# Patient Record
Sex: Female | Born: 1970
Health system: Southern US, Community
[De-identification: ages and names within clinical notes are randomized; demographics above are authoritative.]

## PROBLEM LIST (undated history)

## (undated) DIAGNOSIS — T7840XA Allergy, unspecified, initial encounter: Secondary | ICD-10-CM

## (undated) DIAGNOSIS — F419 Anxiety disorder, unspecified: Secondary | ICD-10-CM

## (undated) DIAGNOSIS — K589 Irritable bowel syndrome without diarrhea: Secondary | ICD-10-CM

## (undated) DIAGNOSIS — F32A Depression, unspecified: Secondary | ICD-10-CM

## (undated) DIAGNOSIS — G43909 Migraine, unspecified, not intractable, without status migrainosus: Secondary | ICD-10-CM

## (undated) DIAGNOSIS — F329 Major depressive disorder, single episode, unspecified: Secondary | ICD-10-CM

## (undated) DIAGNOSIS — K219 Gastro-esophageal reflux disease without esophagitis: Secondary | ICD-10-CM

## (undated) DIAGNOSIS — D649 Anemia, unspecified: Secondary | ICD-10-CM

## (undated) DIAGNOSIS — G2581 Restless legs syndrome: Secondary | ICD-10-CM

## (undated) HISTORY — DX: Irritable bowel syndrome, unspecified: K58.9

## (undated) HISTORY — PX: OTHER SURGICAL HISTORY: SHX169

## (undated) HISTORY — PX: CHOLECYSTECTOMY: SHX55

## (undated) HISTORY — DX: Gastro-esophageal reflux disease without esophagitis: K21.9

## (undated) HISTORY — DX: Migraine, unspecified, not intractable, without status migrainosus: G43.909

## (undated) HISTORY — DX: Anxiety disorder, unspecified: F41.9

## (undated) HISTORY — DX: Anemia, unspecified: D64.9

## (undated) HISTORY — DX: Restless legs syndrome: G25.81

## (undated) HISTORY — DX: Depression, unspecified: F32.A

## (undated) HISTORY — DX: Major depressive disorder, single episode, unspecified: F32.9

## (undated) HISTORY — DX: Allergy, unspecified, initial encounter: T78.40XA

---

## 1998-01-04 ENCOUNTER — Other Ambulatory Visit: Admission: RE | Admit: 1998-01-04 | Discharge: 1998-01-04 | Payer: Self-pay | Admitting: Gynecology

## 1999-01-03 ENCOUNTER — Other Ambulatory Visit: Admission: RE | Admit: 1999-01-03 | Discharge: 1999-01-03 | Payer: Self-pay | Admitting: Gynecology

## 2000-03-12 ENCOUNTER — Other Ambulatory Visit: Admission: RE | Admit: 2000-03-12 | Discharge: 2000-03-12 | Payer: Self-pay | Admitting: Gynecology

## 2001-04-11 ENCOUNTER — Other Ambulatory Visit: Admission: RE | Admit: 2001-04-11 | Discharge: 2001-04-11 | Payer: Self-pay | Admitting: Gynecology

## 2002-05-05 ENCOUNTER — Other Ambulatory Visit: Admission: RE | Admit: 2002-05-05 | Discharge: 2002-05-05 | Payer: Self-pay | Admitting: Gynecology

## 2003-05-07 ENCOUNTER — Other Ambulatory Visit: Admission: RE | Admit: 2003-05-07 | Discharge: 2003-05-07 | Payer: Self-pay | Admitting: Gynecology

## 2004-03-11 ENCOUNTER — Encounter: Admission: RE | Admit: 2004-03-11 | Discharge: 2004-03-11 | Payer: Self-pay | Admitting: Gynecology

## 2004-05-12 ENCOUNTER — Other Ambulatory Visit: Admission: RE | Admit: 2004-05-12 | Discharge: 2004-05-12 | Payer: Self-pay | Admitting: Gynecology

## 2005-05-15 ENCOUNTER — Other Ambulatory Visit: Admission: RE | Admit: 2005-05-15 | Discharge: 2005-05-15 | Payer: Self-pay | Admitting: Gynecology

## 2005-12-04 ENCOUNTER — Ambulatory Visit: Payer: Self-pay | Admitting: Gastroenterology

## 2005-12-17 ENCOUNTER — Ambulatory Visit (HOSPITAL_COMMUNITY): Admission: RE | Admit: 2005-12-17 | Discharge: 2005-12-17 | Payer: Self-pay | Admitting: Gastroenterology

## 2005-12-23 ENCOUNTER — Ambulatory Visit: Payer: Self-pay | Admitting: Gastroenterology

## 2006-02-03 ENCOUNTER — Ambulatory Visit (HOSPITAL_COMMUNITY): Admission: RE | Admit: 2006-02-03 | Discharge: 2006-02-03 | Payer: Self-pay | Admitting: Internal Medicine

## 2006-06-02 ENCOUNTER — Other Ambulatory Visit: Admission: RE | Admit: 2006-06-02 | Discharge: 2006-06-02 | Payer: Self-pay | Admitting: Gynecology

## 2006-07-08 ENCOUNTER — Encounter: Admission: RE | Admit: 2006-07-08 | Discharge: 2006-07-08 | Payer: Self-pay | Admitting: Gynecology

## 2007-06-03 ENCOUNTER — Other Ambulatory Visit: Admission: RE | Admit: 2007-06-03 | Discharge: 2007-06-03 | Payer: Self-pay | Admitting: Gynecology

## 2008-07-13 ENCOUNTER — Encounter: Payer: Self-pay | Admitting: Gynecology

## 2008-07-13 ENCOUNTER — Other Ambulatory Visit: Admission: RE | Admit: 2008-07-13 | Discharge: 2008-07-13 | Payer: Self-pay | Admitting: Gynecology

## 2008-07-13 ENCOUNTER — Ambulatory Visit: Payer: Self-pay | Admitting: Gynecology

## 2009-06-26 ENCOUNTER — Encounter: Admission: RE | Admit: 2009-06-26 | Discharge: 2009-06-26 | Payer: Self-pay | Admitting: Internal Medicine

## 2009-09-13 ENCOUNTER — Encounter: Admission: RE | Admit: 2009-09-13 | Discharge: 2009-09-13 | Payer: Self-pay | Admitting: Gynecology

## 2009-09-20 ENCOUNTER — Ambulatory Visit: Payer: Self-pay | Admitting: Gynecology

## 2009-09-20 ENCOUNTER — Other Ambulatory Visit: Admission: RE | Admit: 2009-09-20 | Discharge: 2009-09-20 | Payer: Self-pay | Admitting: Gynecology

## 2010-04-20 ENCOUNTER — Encounter: Payer: Self-pay | Admitting: Gynecology

## 2010-08-15 NOTE — Assessment & Plan Note (Signed)
[facility]                           GASTROENTEROLOGY OFFICE NOTE   [name], [name]                    MRN:          [id number]  DATE:12/04/2005                            DOB:          31-May-1970    REFERRING PHYSICIAN:  Lucky Cowboy, M.D.   REASON FOR REFERRAL:  Dr. Oneta Rack asked me to evaluate Tina Holmes in  consultation regarding GERD symptoms and abdominal discomfort.   HISTORY OF PRESENT ILLNESS:  [name] is a very pleasant 40 year old  woman who has had 4 to 5 years of GERD-like symptoms. Specifically, she has  had burning in her mid epigastrium and nausea intermittently. This was  improved on Nexium for several years but then more recently, has started to  get worse. She was changed over to Zegerid 40 mg twice daily and has noticed  this has improved slightly. She has not noticed any specific relation to  food, laying down. She does not drink much caffeine, alcohol, or smoke  cigarettes, although she does eat chocolate several times a week.   She was also having trouble with loose stools that began shortly after she  had a laparoscopic cholecystectomy 9 years ago. She changed to a specific  regimen of Loperamide and mecobalamine and on this, her bowels have been  much improved for several years.   REVIEW OF SYSTEMS:  Notable for stable weight. Otherwise is essentially  normal and is available on the nurse intake sheet.   PAST MEDICAL HISTORY:  Laparoscopic cholecystectomy in 1998.   CURRENT MEDICATIONS:  Zegerid, mecobalamine, Loperamide, L-lysine, TriNessa.   ALLERGIES:  CODEINE.   SOCIAL HISTORY:  Married with 1 son. Works as an Product/process development scientist for [employer]. Non-smoker, non-drinker.   FAMILY HISTORY:  No colon cancer or colon polyps in the family.   PHYSICAL EXAMINATION:  VITAL SIGNS:  Height 5 foot, 3 inches. Weight 121  pounds. Blood pressure 120/70. Pulse 60.  GENERAL:  A well appearing,  neurologically alert, oriented x3.  HEENT:  Extraocular muscles intact. Mouth, oropharynx moist. No lesions.  NECK:  Supple. No lymphadenopathy.  CARDIOVASCULAR:  Regular rate and rhythm.  LUNGS:  Clear to auscultation bilaterally.  ABDOMEN:  Soft, nontender, and nondistended. Bowel sounds normal.  EXTREMITIES:  No lower extremity edema.  SKIN:  No rashes or lesions on visible extremities.   ASSESSMENT/PLAN:  A 40 year old woman with gastroesophageal reflux disease-  like symptoms and dyspepsia. She has had 5 years of these symptoms, although  they do seem to be helped with PPI. I think we should proceed with EGD at  soonest convenience to rule out Barrett's or other complications from  longstanding reflux disease. She is current on Zegerid twice daily. I am  going to have her switch back to Nexium. I do not think she was taking it at  the correct time in relation to food. She was taking it about an hour to an  hour and a half prior to her breakfast meal, when it is actually designed to  work best if it is taken 20 to 30 minutes prior to a meal. She will  begin  doing that now. I also recommended that she try going dairy-free for a week  and if that helps, she will do a dairy challenge. She does eat a bit of  chocolate and maybe that is pushing her over the edge with her heartburn so  if dairy does not make a difference, she is going to try cutting out  chocolate for several days. She will have her EGD shortly after all of this  and we will talk about how both of these dietary maneuvers has affected her.  Will also get a C-met today. She had a CBC done earlier this month, showing  a hemoglobin of 11.8.                                   Rachael Fee, MD   DPJ/MedQ  DD:  12/04/2005  DT:  12/05/2005  Job #:  [id number]   cc:   Lucky Cowboy, M.D.

## 2010-10-07 ENCOUNTER — Other Ambulatory Visit: Payer: Self-pay | Admitting: Gynecology

## 2010-10-07 DIAGNOSIS — Z1231 Encounter for screening mammogram for malignant neoplasm of breast: Secondary | ICD-10-CM

## 2010-10-14 ENCOUNTER — Ambulatory Visit
Admission: RE | Admit: 2010-10-14 | Discharge: 2010-10-14 | Disposition: A | Discharge status: Home / Self Care | Payer: Commercial Managed Care - PPO | Source: Ambulatory Visit | Attending: Gynecology | Admitting: Gynecology

## 2010-10-14 DIAGNOSIS — Z1231 Encounter for screening mammogram for malignant neoplasm of breast: Secondary | ICD-10-CM

## 2010-12-23 ENCOUNTER — Other Ambulatory Visit: Payer: Self-pay | Admitting: Gynecology

## 2011-01-07 DIAGNOSIS — K219 Gastro-esophageal reflux disease without esophagitis: Secondary | ICD-10-CM | POA: Insufficient documentation

## 2011-01-14 ENCOUNTER — Ambulatory Visit (INDEPENDENT_AMBULATORY_CARE_PROVIDER_SITE_OTHER): Payer: Commercial Managed Care - PPO | Admitting: Gynecology

## 2011-01-14 ENCOUNTER — Encounter: Payer: Self-pay | Admitting: Gynecology

## 2011-01-14 VITALS — BP 128/80 | Ht 62.25 in | Wt 137.0 lb

## 2011-01-14 DIAGNOSIS — Z01419 Encounter for gynecological examination (general) (routine) without abnormal findings: Secondary | ICD-10-CM

## 2011-01-14 DIAGNOSIS — Z3049 Encounter for surveillance of other contraceptives: Secondary | ICD-10-CM

## 2011-01-14 DIAGNOSIS — Z23 Encounter for immunization: Secondary | ICD-10-CM

## 2011-01-14 MED ORDER — NORGESTIM-ETH ESTRAD TRIPHASIC 0.18/0.215/0.25 MG-35 MCG PO TABS: 1.0000 | ORAL_TABLET | Freq: Every day | ORAL | Status: DC

## 2011-01-14 NOTE — Progress Notes (Signed)
Tina Holmes 19-May-1970 [id number]        40 y.o.  for annual exam.  Doing well on oral contraceptives.  Past medical history,surgical history, medications, allergies, family history and social history were all reviewed and documented in the EPIC chart. ROS:  Was performed and pertinent positives and negatives are included in the history.  Exam: chaperone present Filed Vitals:   01/14/11 1448  BP: 128/80   General appearance  Normal Skin grossly normal Head/Neck normal with no cervical or supraclavicular adenopathy thyroid normal Lungs  clear Cardiac RR, without RMG Abdominal  soft, nontender, without masses, organomegaly or hernia Breasts  examined lying and sitting without masses, retractions, discharge or axillary adenopathy. Pelvic  Ext/BUS/vagina  normal   Cervix  normal  Pap not done  Uterus  anteverted, normal size, shape and contour, midline and mobile nontender   Adnexa  Without masses or tenderness    Anus and perineum  normal   Rectovaginal  normal sphincter tone without palpated masses or tenderness.    Assessment/Plan:  40 y.o. female for annual exam.   Doing well. On oral contraceptives wants to continue, I refilled her times a year. I discussed risks of stroke heart attack DVT she is low risk not being followed for medical issues does not smoke accepts the risks. Patient has history of mild dysplasia over 20 years ago status post cryo-. Her Pap smears have all been normal since then for the last 20 years. I discussed current screening recommendations with decreased frequency at every 3 years and she wants to go ahead and do this so I did not do a Pap smear today as her last Pap smear was last year.  Self breast exams on a monthly basis discussed encouraged. Had mammography in July we'll continue with annual mammography. She had blood work done to her primary did show some iron deficiency anemia although she never started iron supplement I recommend she start an iron  supplement daily. Her other blood work is normal and no blood work was done today.  Assuming she continues well she'll see me in a year sooner as needed. Flu shot received today.    Dara Lords MD, 3:27 PM 01/14/2011

## 2011-10-27 ENCOUNTER — Other Ambulatory Visit: Payer: Self-pay | Admitting: Gynecology

## 2011-10-27 DIAGNOSIS — Z1231 Encounter for screening mammogram for malignant neoplasm of breast: Secondary | ICD-10-CM

## 2011-11-09 ENCOUNTER — Ambulatory Visit
Admission: RE | Admit: 2011-11-09 | Discharge: 2011-11-09 | Disposition: A | Discharge status: Home / Self Care | Payer: Commercial Managed Care - PPO | Source: Ambulatory Visit | Attending: Gynecology | Admitting: Gynecology

## 2011-11-09 DIAGNOSIS — Z1231 Encounter for screening mammogram for malignant neoplasm of breast: Secondary | ICD-10-CM

## 2012-01-11 ENCOUNTER — Telehealth: Payer: Self-pay | Admitting: *Deleted

## 2012-01-11 NOTE — Telephone Encounter (Signed)
Pt calling c/o UTI, left message on voicemail, OV best.

## 2012-02-17 ENCOUNTER — Other Ambulatory Visit: Payer: Self-pay | Admitting: Gynecology

## 2012-02-29 ENCOUNTER — Encounter: Payer: Commercial Managed Care - PPO | Admitting: Gynecology

## 2012-03-09 ENCOUNTER — Other Ambulatory Visit (HOSPITAL_COMMUNITY)
Admission: RE | Admit: 2012-03-09 | Discharge: 2012-03-09 | Disposition: A | Discharge status: Home / Self Care | Payer: Commercial Managed Care - PPO | Source: Ambulatory Visit | Attending: Gynecology | Admitting: Gynecology

## 2012-03-09 ENCOUNTER — Ambulatory Visit (INDEPENDENT_AMBULATORY_CARE_PROVIDER_SITE_OTHER): Payer: Commercial Managed Care - PPO | Admitting: Gynecology

## 2012-03-09 ENCOUNTER — Encounter: Payer: Self-pay | Admitting: Gynecology

## 2012-03-09 VITALS — BP 114/60 | Ht 62.5 in | Wt 135.0 lb

## 2012-03-09 DIAGNOSIS — Z01419 Encounter for gynecological examination (general) (routine) without abnormal findings: Secondary | ICD-10-CM | POA: Insufficient documentation

## 2012-03-09 DIAGNOSIS — Z1151 Encounter for screening for human papillomavirus (HPV): Secondary | ICD-10-CM | POA: Insufficient documentation

## 2012-03-09 DIAGNOSIS — Z309 Encounter for contraceptive management, unspecified: Secondary | ICD-10-CM

## 2012-03-09 MED ORDER — NORGESTIM-ETH ESTRAD TRIPHASIC 0.18/0.215/0.25 MG-35 MCG PO TABS: 1.0000 | ORAL_TABLET | Freq: Every day | ORAL | Status: DC

## 2012-03-09 NOTE — Patient Instructions (Addendum)
Intrauterine Device Insertion Most often, an intrauterine device (IUD) is inserted into the uterus to prevent pregnancy. There are 2 types of IUDs available:  Copper IUD. This type of IUD creates an environment that is not favorable to sperm survival. The mechanism of action of the copper IUD is not known for certain. It can stay in place for 10 years.  Hormone IUD. This type of IUD contains the hormone progestin (synthetic progesterone). The progestin thickens the cervical mucus and prevents sperm from entering the uterus, and it also thins the uterine lining. There is no evidence that the hormone IUD prevents implantation. The hormone IUD can stay in place for up to 5 years. An IUD is the most cost-effective birth control if left in place for the full duration. It may be removed at any time. LET YOUR CAREGIVER KNOW ABOUT:  Sensitivity to metals.  Medicines taken including herbs, eyedrops, over-the-counter medicines, and creams.  Use of steroids (by mouth or creams).  Previous problems with anesthetics or numbing medicine.  Previous gynecological surgery.  History of blood clots or clotting disorders.  Possibility of pregnancy.  Menstrual irregularities.  Concerns regarding unusual vaginal discharge or odors.  Previous experience with an IUD.  Other health problems. RISKS AND COMPLICATIONS  Accidental puncture (perforation) of the uterus.  Accidental placement of the IUD either in the muscle layer of the uterus (myometrium) or outside the uterus. If this happen, the IUD can be found essentially floating around the bowels. When this happens, the IUD must be taken out surgically.  The IUD may fall out of the uterus (expulsion). This is more common in women who have recently had a child.   Pregnancy in the fallopian tube (ectopic). BEFORE THE PROCEDURE  Schedule the IUD insertion for when you will have your menstrual period or right after, to make sure you are not pregnant.  Placement of the IUD is better tolerated shortly after a menstrual cycle.  You may need to take tests or be examined to make sure you are not pregnant.  You may be required to take a pregnancy test.  You may be required to get checked for sexually transmitted infections (STIs) prior to placement. Placing an IUD in someone who has an infection can make an infection worse.  You may be given a pain reliever to take 1 or 2 hours before the procedure.  An exam will be performed to determine the size and position of your uterus.  Ask your caregiver about changing or stopping your regular medicines. PROCEDURE   A tool (speculum) is placed in the vagina. This allows your caregiver to see the lower part of the uterus (cervix).  The cervix is prepped with a medicine that lowers the risk of infection.  You may be given a medicine to numb each side of the cervix (intracervical or paracervical block). This is used to block and control any discomfort with insertion.  A tool (uterine sound) is inserted into the uterus to determine the length of the uterine cavity and the direction the uterus may be tilted.  A slim instrument (IUD inserter) is inserted through the cervical canal and into your uterus.  The IUD is placed in the uterine cavity and the insertion device is removed.  The nylon string that is attached to the IUD, and used for eventual IUD removal, is trimmed. It is trimmed so that it lays high in the vagina, just outside the cervix. AFTER THE PROCEDURE  You may have bleeding after the  attached to the IUD, and used for eventual IUD removal, is trimmed. It is trimmed so that it lays high in the vagina, just outside the cervix.  AFTER THE PROCEDURE  · You may have bleeding after the procedure. This is normal. It varies from light spotting for a few days to menstrual-like bleeding.   · You may have mild cramping.  · Practice checking the string coming out of the cervix to make sure the IUD remains in the uterus. If you cannot feel the string, you should schedule a "string check" with your caregiver.  · If you had a hormone IUD inserted, expect that your period may be lighter or nonexistent  within a year's time (though this is not always the case). There may be delayed fertility with the hormone IUD as a result of its progesterone effect. When you are ready to become pregnant, it is suggested to have the IUD removed up to 1 year in advance.  · Yearly exams are advised.  Document Released: 11/12/2010 Document Revised: 06/08/2011 Document Reviewed: 11/12/2010  Exit[employer]® Patient Information ©2013 [employer].

## 2012-03-09 NOTE — Progress Notes (Signed)
Tina Holmes 08-Nov-1970 [id number]        41 y.o.  G2P0011 for annual exam.  Doing well.  Past medical history,surgical history, medications, allergies, family history and social history were all reviewed and documented in the EPIC chart. ROS:  Was performed and pertinent positives and negatives are included in the history.  Exam: Sherrilyn Rist assistant Filed Vitals:   03/09/12 1502  BP: 114/60  Height: 5' 2.5" (1.588 m)  Weight: 135 lb (61.236 kg)   General appearance  Normal Skin grossly normal Head/Neck normal with no cervical or supraclavicular adenopathy thyroid normal Lungs  clear Cardiac RR, without RMG Abdominal  soft, nontender, without masses, organomegaly or hernia Breasts  examined lying and sitting without masses, retractions, discharge or axillary adenopathy. Pelvic  Ext/BUS/vagina  normal   Cervix  normal Pap/HPV done  Uterus  anteverted, normal size, shape and contour, midline and mobile nontender   Adnexa  Without masses or tenderness    Anus and perineum  normal   Rectovaginal  normal sphincter tone without palpated masses or tenderness.    Assessment/Plan:  41 y.o. G57P0011 female for annual exam.   1. Contraceptive management. Patient on low-dose oral contraceptives. Husband refusing vasectomy. Discussed alternatives to include continuing low-dose pills. Risk of stroke heart attack DVT reviewed. Switching to a different method such as IUD. I gave her literature on the Mirena IUD and discussed the insertional process. I reviewed risks in general to include infection perforation migration and failure. Patient wants to think about follow up if she wants to schedule. Otherwise I refilled her oral contraceptives to continue at present. 2. Mammography 10/2011. Continue with annual mammography. SBE monthly reviewed. 3. Pap smear 08/2009. Patient nervous about the new guidelines would prefer Pap smear this year. Pap/HPV done. Does have history of mild dysplasia status post  cryo-surgery 20 years ago with normal intervening Pap smears. 4. Health maintenance. No labs done as they were all done at her primary physician's office follow up one year or with IUD if she chooses to have placed. 5. Health maintenance.    Dara Lords MD, 3:41 PM 03/09/2012

## 2012-05-13 ENCOUNTER — Other Ambulatory Visit: Payer: Self-pay | Admitting: Gynecology

## 2013-01-02 ENCOUNTER — Other Ambulatory Visit: Payer: Self-pay

## 2013-01-02 DIAGNOSIS — Z1231 Encounter for screening mammogram for malignant neoplasm of breast: Secondary | ICD-10-CM

## 2013-01-12 ENCOUNTER — Ambulatory Visit: Payer: Commercial Managed Care - PPO

## 2013-01-16 ENCOUNTER — Ambulatory Visit
Admission: RE | Admit: 2013-01-16 | Discharge: 2013-01-16 | Disposition: A | Discharge status: Home / Self Care | Payer: Commercial Managed Care - PPO | Source: Ambulatory Visit

## 2013-01-16 DIAGNOSIS — Z1231 Encounter for screening mammogram for malignant neoplasm of breast: Secondary | ICD-10-CM

## 2013-02-15 ENCOUNTER — Encounter: Payer: Self-pay | Admitting: Internal Medicine

## 2013-02-15 DIAGNOSIS — K589 Irritable bowel syndrome without diarrhea: Secondary | ICD-10-CM | POA: Insufficient documentation

## 2013-02-15 DIAGNOSIS — G43909 Migraine, unspecified, not intractable, without status migrainosus: Secondary | ICD-10-CM | POA: Insufficient documentation

## 2013-02-16 ENCOUNTER — Ambulatory Visit: Payer: Commercial Managed Care - PPO | Admitting: Emergency Medicine

## 2013-02-16 ENCOUNTER — Encounter: Payer: Self-pay | Admitting: Emergency Medicine

## 2013-02-16 VITALS — BP 110/72 | HR 60 | Temp 98.2°F | Resp 18 | Ht 63.0 in | Wt 143.0 lb

## 2013-02-16 DIAGNOSIS — F411 Generalized anxiety disorder: Secondary | ICD-10-CM

## 2013-02-16 DIAGNOSIS — D649 Anemia, unspecified: Secondary | ICD-10-CM

## 2013-02-16 DIAGNOSIS — R002 Palpitations: Secondary | ICD-10-CM

## 2013-02-16 DIAGNOSIS — R5381 Other malaise: Secondary | ICD-10-CM

## 2013-02-16 DIAGNOSIS — R5383 Other fatigue: Secondary | ICD-10-CM

## 2013-02-16 DIAGNOSIS — E559 Vitamin D deficiency, unspecified: Secondary | ICD-10-CM

## 2013-02-16 DIAGNOSIS — Z23 Encounter for immunization: Secondary | ICD-10-CM

## 2013-02-16 LAB — CBC WITH DIFFERENTIAL/PLATELET
Basophils Absolute: 0 10*3/uL (ref 0.0–0.1)
HCT: 36.8 % (ref 36.0–46.0)
Hemoglobin: 12 g/dL (ref 12.0–15.0)
Lymphocytes Relative: 23 % (ref 12–46)
Lymphs Abs: 1.5 10*3/uL (ref 0.7–4.0)
MCH: 27.2 pg (ref 26.0–34.0)
MCHC: 32.6 g/dL (ref 30.0–36.0)
Monocytes Absolute: 0.5 10*3/uL (ref 0.1–1.0)
Neutro Abs: 4.3 10*3/uL (ref 1.7–7.7)
Platelets: 277 10*3/uL (ref 150–400)
RBC: 4.41 MIL/uL (ref 3.87–5.11)
RDW: 16.4 % — ABNORMAL HIGH (ref 11.5–15.5)
WBC: 6.4 10*3/uL (ref 4.0–10.5)

## 2013-02-16 LAB — BASIC METABOLIC PANEL WITH GFR
BUN: 10 mg/dL (ref 6–23)
CO2: 29 mEq/L (ref 19–32)
Calcium: 9.3 mg/dL (ref 8.4–10.5)
Chloride: 102 mEq/L (ref 96–112)
GFR, Est African American: >89 mL/min
GFR, Est Non African American: >89 mL/min
Glucose, Bld: 89 mg/dL (ref 70–99)
Potassium: 4.4 mEq/L (ref 3.5–5.3)
Sodium: 140 mEq/L (ref 135–145)

## 2013-02-16 MED ORDER — DIAZEPAM 2 MG PO TABS: 2.0000 mg | ORAL_TABLET | Freq: Three times a day (TID) | ORAL | Status: DC | PRN

## 2013-02-16 NOTE — Patient Instructions (Addendum)
Vitamin B12 Deficiency Not having enough vitamin B12 is called a deficiency. Your body needs this vitamin for important body functions. HOME CARE  Take all vitamins, herbs, or nutrition drinks (supplements) as told by your doctor.  Get shots (injections) as told. Do not miss your doctor visit.  Eat foods than contain vitamin B12. This includes:  Meat.  Chicken, [place], or other birds (poultry).  Fish.  Eggs.  Cereals or milk with added vitamin B12. Check the label.  Do not drink too much (abuse) alcohol.  Keep all doctor visits as told. GET HELP IF:  You have questions.  Your problems come back. MAKE SURE YOU:  Understand these instructions.  Will watch your condition.  Will get help right away if you are not doing well or get worse. Document Released: 03/05/2011 Document Revised: 06/08/2011 Document Reviewed: 03/05/2011 [facility] Patient Information 2014 [place], [place]. Palpitations  A palpitation is the feeling that your heartbeat is irregular. It may feel like your heart is fluttering or skipping a beat. It may also feel like your heart is beating faster than normal. This is usually not a serious problem. In some cases, you may need more medical tests. HOME CARE  Avoid:  Caffeine in coffee, tea, soft drinks, diet pills, and energy drinks.  Chocolate.  Alcohol.  Stop smoking if you smoke.  Reduce your stress and anxiety. Try:  A method that measures bodily functions so you can learn to control them (biofeedback).  Yoga.  Meditation.  Physical activity such as swimming, jogging, or walking.  Get plenty of rest and sleep. GET HELP RIGHT AWAY IF:   You have chest pain.  You feel short of breath.  You have a very bad headache.  You feel dizzy or pass out (faint).  Your fast or irregular heartbeat continues after 24 hours.  Your palpitations occur more often. MAKE SURE YOU:   Understand these instructions.  Will watch your condition.  Will  get help right away if you are not doing well or get worse. Document Released: 12/24/2007 Document Revised: 09/15/2011 Document Reviewed: 05/15/2011 [facility] Patient Information 2014 [place], [place].

## 2013-02-17 LAB — VITAMIN B12: Vitamin B-12: 359 pg/mL (ref 211–911)

## 2013-02-17 LAB — TSH: TSH: 1.273 u[IU]/mL (ref 0.350–4.500)

## 2013-02-17 NOTE — Progress Notes (Signed)
  Subjective:    Patient ID: Tina Holmes, female    DOB: January 08, 1971, 42 y.o.   MRN: [id number]  HPI Comments: 42 yo presents for f/u with anemia. She has not added B12 VIT or increased greens AD. She is still with fatigue and has occasional palpitations. She denies specific triggers for palpitations, but admits to increase stress, not eating regularly, and low H2O intake. She notes migraines have increased with stress but Fioricet helps significantly. She tried a coworkers Valium at work when she was having increased stress and she felt it worked better than Xanax.    Current Outpatient Prescriptions on File Prior to Visit  Medication Sig Dispense Refill  . ALPRAZolam (XANAX) 1 MG tablet Take 1 mg by mouth at bedtime as needed for anxiety.      . Cholecalciferol (VITAMIN D PO) Take 6,000 Units by mouth.         No current facility-administered medications on file prior to visit.  SHE IS ALSO ON TRIPREVIFEM OCP FIORICET 50/325/40  ALLERGIES Celexa; Codeine; Imitrex; and Zoloft  Past Medical History  Diagnosis Date  . Restless leg syndrome   . Depression   . GERD (gastroesophageal reflux disease)   . IBS (irritable bowel syndrome)   . Migraines   . Anxiety     Review of Systems  Constitutional: Positive for fatigue.  Cardiovascular: Positive for palpitations.  Neurological: Positive for headaches.  Psychiatric/Behavioral: The patient is nervous/anxious.   All other systems reviewed and are negative.    BP 110/72  Pulse 60  Temp(Src) 98.2 F (36.8 C) (Temporal)  Resp 18  Ht 5\' 3"  (1.6 m)  Wt 143 lb (64.864 kg)  BMI 25.34 kg/m2  LMP 02/09/2013     Objective:   Physical Exam  Nursing note and vitals reviewed. Constitutional: She is oriented to person, place, and time. She appears well-developed and well-nourished.  HENT:  Head: Normocephalic and atraumatic.  Right Ear: External ear normal.  Left Ear: External ear normal.  Eyes: Conjunctivae are normal. Pupils  are equal, round, and reactive to light.  Neck: Normal range of motion. Neck supple. No thyromegaly present.  Cardiovascular: Normal rate, regular rhythm, normal heart sounds and intact distal pulses.   Pulmonary/Chest: Effort normal and breath sounds normal.  Abdominal: Soft. Bowel sounds are normal. She exhibits no distension and no mass. There is no tenderness.  Musculoskeletal: Normal range of motion.  Lymphadenopathy:    She has no cervical adenopathy.  Neurological: She is alert and oriented to person, place, and time. No cranial nerve deficit. Coordination normal.  Skin: Skin is warm and dry.  Psychiatric: She has a normal mood and affect. Her behavior is normal. Judgment and thought content normal.          Assessment & Plan:  1. F/u anemia with history of fatigue and palpitations, recheck labs. Advise regular exercise, healthier diet with increased protein snacks, increased H2O. If labs neg and no improvement will need cardiac eval, she w/c with results 2. Anxiety/ stress- discussed stress reduction, switch from XANAX to Valium AD will call with results.

## 2013-02-20 ENCOUNTER — Encounter: Payer: Self-pay | Admitting: Emergency Medicine

## 2013-02-20 MED ORDER — LORAZEPAM 1 MG PO TABS: ORAL_TABLET | ORAL | Status: DC

## 2013-03-13 ENCOUNTER — Encounter: Payer: Commercial Managed Care - PPO | Admitting: Gynecology

## 2013-03-28 ENCOUNTER — Other Ambulatory Visit: Payer: Self-pay | Admitting: Gynecology

## 2013-03-28 NOTE — Telephone Encounter (Signed)
Annual scheduled on 04/24/13

## 2013-04-10 ENCOUNTER — Other Ambulatory Visit: Payer: Self-pay | Admitting: Emergency Medicine

## 2013-04-10 MED ORDER — LORAZEPAM 1 MG PO TABS: ORAL_TABLET | ORAL | Status: DC

## 2013-04-24 ENCOUNTER — Encounter: Payer: Self-pay | Admitting: Gynecology

## 2013-04-24 ENCOUNTER — Ambulatory Visit (INDEPENDENT_AMBULATORY_CARE_PROVIDER_SITE_OTHER): Payer: Commercial Managed Care - PPO | Admitting: Gynecology

## 2013-04-24 VITALS — BP 120/74 | Ht 62.0 in | Wt 141.0 lb

## 2013-04-24 DIAGNOSIS — N898 Other specified noninflammatory disorders of vagina: Secondary | ICD-10-CM

## 2013-04-24 DIAGNOSIS — Z01419 Encounter for gynecological examination (general) (routine) without abnormal findings: Secondary | ICD-10-CM

## 2013-04-24 DIAGNOSIS — N949 Unspecified condition associated with female genital organs and menstrual cycle: Secondary | ICD-10-CM

## 2013-04-24 LAB — URINALYSIS W MICROSCOPIC + REFLEX CULTURE
BILIRUBIN URINE: NEGATIVE
Bacteria, UA: NONE SEEN
CASTS: NONE SEEN
CRYSTALS: NONE SEEN
Glucose, UA: NEGATIVE mg/dL
Ketones, ur: NEGATIVE mg/dL
NITRITE: NEGATIVE
PH: 5.5 (ref 5.0–8.0)
Protein, ur: NEGATIVE mg/dL
Specific Gravity, Urine: 1.024 (ref 1.005–1.030)
Urobilinogen, UA: 0.2 mg/dL (ref 0.0–1.0)

## 2013-04-24 LAB — WET PREP FOR TRICH, YEAST, CLUE
Clue Cells Wet Prep HPF POC: NONE SEEN
Trich, Wet Prep: NONE SEEN
YEAST WET PREP: NONE SEEN

## 2013-04-24 MED ORDER — METRONIDAZOLE 500 MG PO TABS: 500.0000 mg | ORAL_TABLET | Freq: Two times a day (BID) | ORAL | Status: DC

## 2013-04-24 MED ORDER — NORGESTIM-ETH ESTRAD TRIPHASIC 0.18/0.215/0.25 MG-35 MCG PO TABS: ORAL_TABLET | ORAL | Status: DC

## 2013-04-24 NOTE — Progress Notes (Signed)
Tina Holmes 02-Apr-1970 [id number]        43 y.o.  G2P0011 for annual exam.  Several issues noted below.  Past medical history,surgical history, problem list, medications, allergies, family history and social history were all reviewed and documented in the EPIC chart.  ROS:  Performed and pertinent positives and negatives are included in the history, assessment and plan .  Exam: Kim assistant Filed Vitals:   04/24/13 0844  BP: 120/74  Height: 5\' 2"  (1.575 m)  Weight: 141 lb (63.957 kg)   General appearance  Normal Skin grossly normal Head/Neck normal with no cervical or supraclavicular adenopathy thyroid normal Lungs  clear Cardiac RR, without RMG Abdominal  soft, nontender, without masses, organomegaly or hernia Breasts  examined lying and sitting without masses, retractions, discharge or axillary adenopathy. Pelvic  Ext/BUS/vagina  Normal  Cervix  Normal  Uterus  anteverted, normal size, shape and contour, midline and mobile nontender   Adnexa  Without masses or tenderness    Anus and perineum  Normal   Rectovaginal  Normal sphincter tone without palpated masses or tenderness.    Assessment/Plan:  43 y.o. G58P0011 female for annual exam regular menses, oral contraceptives.   1. Contraceptive management. I again reviewed oral contraceptives with advancing age and history of cigarette smoking in the past. Possible increased risk of thrombosis to include stroke heart attack DVT. We discussed alternatives multiple times in the past and she is comfortable continuing on oral contraceptives. She clearly understands the risks and accepts them. I refilled her Tri Previfem x1 year. 2. Vaginal odor. Patient notes a constant low-level vaginal odor. No discharge itching or other symptoms. Exam is normal. Wet prep is negative. We'll treat as a low-level bacterial vaginosis with Flagyl 500 mg twice a day x7 days, alcohol avoidance reviewed. Followup if symptoms persist, 3. Pap smear/HPV  negative 02/2012. No Pap smear done today. No history of significant abnormal Pap smears. Plan less frequent screening intervals. 4. Mammography 12/2012. Continue with annual mammography. SBE monthly reviewed. 5. Health maintenance. No blood work done as this is reportedly done through her primary physician's office. Followup one year, sooner as needed.    Note: This document was prepared with digital dictation and possible smart phrase technology. Any transcriptional errors that result from this process are unintentional.   Anastasio Auerbach MD, 9:23 AM 04/24/2013

## 2013-04-24 NOTE — Patient Instructions (Signed)
Take oral antibiotic twice daily for 7 days. Avoid alcohol while taking. Followup if your symptoms continue. Followup in one year for annual exam.

## 2013-04-25 LAB — URINE CULTURE
Colony Count: NO GROWTH
Organism ID, Bacteria: NO GROWTH

## 2013-06-26 ENCOUNTER — Other Ambulatory Visit: Payer: Self-pay

## 2013-06-26 MED ORDER — NORGESTIM-ETH ESTRAD TRIPHASIC 0.18/0.215/0.25 MG-35 MCG PO TABS: ORAL_TABLET | ORAL | Status: DC

## 2013-06-30 ENCOUNTER — Other Ambulatory Visit: Payer: Self-pay | Admitting: Emergency Medicine

## 2013-08-10 ENCOUNTER — Encounter: Payer: Self-pay | Admitting: Internal Medicine

## 2013-08-10 ENCOUNTER — Ambulatory Visit (INDEPENDENT_AMBULATORY_CARE_PROVIDER_SITE_OTHER): Payer: Commercial Managed Care - PPO | Admitting: Internal Medicine

## 2013-08-10 VITALS — BP 110/76 | HR 72 | Temp 98.0°F | Resp 16 | Ht 62.5 in | Wt 138.0 lb

## 2013-08-10 DIAGNOSIS — R740 Nonspecific elevation of levels of transaminase and lactic acid dehydrogenase [LDH]: Secondary | ICD-10-CM

## 2013-08-10 DIAGNOSIS — Z0000 Encounter for general adult medical examination without abnormal findings: Secondary | ICD-10-CM

## 2013-08-10 DIAGNOSIS — R7401 Elevation of levels of liver transaminase levels: Secondary | ICD-10-CM

## 2013-08-10 DIAGNOSIS — Z111 Encounter for screening for respiratory tuberculosis: Secondary | ICD-10-CM

## 2013-08-10 DIAGNOSIS — Z113 Encounter for screening for infections with a predominantly sexual mode of transmission: Secondary | ICD-10-CM

## 2013-08-10 DIAGNOSIS — E559 Vitamin D deficiency, unspecified: Secondary | ICD-10-CM | POA: Insufficient documentation

## 2013-08-10 DIAGNOSIS — Z1212 Encounter for screening for malignant neoplasm of rectum: Secondary | ICD-10-CM

## 2013-08-10 NOTE — Patient Instructions (Signed)

## 2013-08-10 NOTE — Progress Notes (Signed)
Patient ID: Tina Holmes, female   DOB: [birth year], 43 y.o.   MRN: [id number]   Annual Screening Comprehensive Examination   This very nice 43 y.o.MWF presents for complete physical.  Patient has no major health issues.    Patient has hx/o GERD & IBS - both of which have been quiescent over the last year.    Patient does have Hx/o borderline elevated A1c  o f 5.7% in 2011, then 5.8% in 2012 and last was 5.5% in May 2014. She has gained about 20 # over the last 4-5 years. She doesn't exercise.    Finally, patient has history of Vitamin D Deficiency with last vitamin D of 46 in May 2014.  Medication Sig  . Cholecalciferol (VITAMIN D PO) Take 6,000 Units by mouth.    . dicyclomine (BENTYL) 10 MG  Take 10 mg by mouth 4 (four) times daily as needed for spasms.  Marland Kitchen LORazepam (ATIVAN) 1 MG tablet TAKE 1 TABLET AT BEDTIME .Marland KitchenMarland Kitchen OCCASSIONALLY TWICE A DAY  . Norgestimate-Ethinyl Estradiol Triphasic (TRI-PREVIFEM) 0.18/0.215/0.25 MG-35 MCG  TAKE 1 TABLET BY MOUTH DAILY.   Allergies  Allergen Reactions  . Imitrex [Sumatriptan]     Throat swelling  . Celexa [Citalopram]     Decreased libido  . Codeine     N/V  . Zoloft [Sertraline Hcl]     N/V   Past Medical History  Diagnosis Date  . Restless leg syndrome   . Depression   . GERD (gastroesophageal reflux disease)   . IBS (irritable bowel syndrome)   . Migraines   . Anxiety     Past Surgical History  Procedure Laterality Date  . Termination of pregnanc      18 WEEKS--DOWNS SYNDROME  . Cholecystectomy     Family History  Problem Relation Age of Onset  . Diabetes Mother   . Hypertension Mother   . Heart attack Mother   . Hypertension Father   . CVA Maternal Grandfather    History   Social History  . Marital Status: Married    Spouse Name: N/A    Number of Children: N/A  . Years of Education: N/A   Occupational History  . Not on file.   Social History Main Topics  . Smoking status: Former Smoker    Quit date:  03/30/1996  . Smokeless tobacco: Never Used  . Alcohol Use: Yes     Comment: OCC  . Drug Use: No  . Sexual Activity: Yes    Birth Control/ Protection: Pill   Other Topics Concern  . Not on file   Social History Narrative  . No narrative on file    ROS Constitutional: Denies fever, chills, weight loss/gain, headaches, insomnia, fatigue, night sweats, and change in appetite. Eyes: Denies redness, blurred vision, diplopia, discharge, itchy, watery eyes.  ENT: Denies discharge, congestion, post nasal drip, epistaxis, sore throat, earache, hearing loss, dental pain, Tinnitus, Vertigo, Sinus pain, snoring.  Cardio: Denies chest pain, palpitations, irregular heartbeat, syncope, dyspnea, diaphoresis, orthopnea, PND, claudication, edema Respiratory: denies cough, dyspnea, DOE, pleurisy, hoarseness, laryngitis, wheezing.  Gastrointestinal: Denies dysphagia, heartburn, reflux, water brash, pain, cramps, nausea, vomiting, bloating, diarrhea, constipation, hematemesis, melena, hematochezia, jaundice, hemorrhoids Genitourinary: Denies dysuria, frequency, urgency, nocturia, hesitancy, discharge, hematuria, flank pain Breast: Breast lumps, nipple discharge, bleeding.  Musculoskeletal: Denies arthralgia, myalgia, stiffness, Jt. Swelling, pain, limp, and strain/sprain. Skin: Denies puritis, rash, hives, warts, acne, eczema, changing in skin lesion Neuro: Weakness, tremor, incoordination, spasms, paresthesia, pain Psychiatric: Denies confusion, memory loss,  sensory loss. Endocrine: Denies change in weight, skin, hair change, nocturia, and paresthesia, diabetic polys, visual blurring, hyper /hypo glycemic episodes.  Heme/Lymph: No excessive bleeding, bruising, enlarged lymph nodes.   Physical Exam  BP 110/76  Pulse 72  Temp 98 F   Resp 16  Ht 5' 2.5"   Wt 138 lb   BMI 24.82 kg/m2  LMP 07/27/2013  General Appearance: Well nourished, in no apparent distress. Eyes: PERRLA, EOMs, conjunctiva no  swelling or erythema, normal fundi and vessels. Sinuses: No frontal/maxillary tenderness ENT/Mouth: EACs patent / TMs  nl. Nares clear without erythema, swelling, mucoid exudates. Oral hygiene is good. No erythema, swelling, or exudate. Tongue normal, non-obstructing. Tonsils not swollen or erythematous. Hearing normal.  Neck: Supple, thyroid normal. No bruits, nodes or JVD. Respiratory: Respiratory effort normal.  BS equal and clear bilateral without rales, rhonci, wheezing or stridor. Cardio: Heart sounds are normal with regular rate and rhythm and no murmurs, rubs or gallops. Peripheral pulses are normal and equal bilaterally without edema. No aortic or femoral bruits. Chest: symmetric with normal excursions and percussion. Breasts: Symmetric, without lumps, nipple discharge, retractions, or fibrocystic changes.  Abdomen: Flat, soft, with bowl sounds. Nontender, no guarding, rebound, hernias, masses, or organomegaly.  Lymphatics: Non tender without lymphadenopathy.  Genitourinary:  Musculoskeletal: Full ROM all peripheral extremities, joint stability, 5/5 strength, and normal gait. Skin: Warm and dry without rashes, lesions, cyanosis, clubbing or  ecchymosis.  Neuro: Cranial nerves intact, reflexes equal bilaterally. Normal muscle tone, no cerebellar symptoms. Sensation intact.  Pysch: Awake and oriented X 3, normal affect, Insight and Judgment appropriate.   Assessment and Plan  1. Annual Screening Examination 2. Vitamin D Deficiency 3.  GERD 4.  IBS 5. Vascular Headaches 6. Prediabetes  Continue prudent diet as discussed, weight control, regular exercise, and medications. Routine screening labs and tests as requested with regular follow-up as recommended. Discussed the book "The End of Dieting".

## 2013-08-11 LAB — CBC WITH DIFFERENTIAL/PLATELET
BASOS PCT: 0 % (ref 0–1)
Basophils Absolute: 0 10*3/uL (ref 0.0–0.1)
Eosinophils Absolute: 0.1 10*3/uL (ref 0.0–0.7)
Eosinophils Relative: 2 % (ref 0–5)
HCT: 36.6 % (ref 36.0–46.0)
Hemoglobin: 11.8 g/dL — ABNORMAL LOW (ref 12.0–15.0)
Lymphocytes Relative: 24 % (ref 12–46)
Lymphs Abs: 1.8 10*3/uL (ref 0.7–4.0)
MCH: 28.5 pg (ref 26.0–34.0)
MCHC: 32.2 g/dL (ref 30.0–36.0)
MCV: 88.4 fL (ref 78.0–100.0)
MONOS PCT: 8 % (ref 3–12)
Monocytes Absolute: 0.6 10*3/uL (ref 0.1–1.0)
NEUTROS ABS: 4.8 10*3/uL (ref 1.7–7.7)
NEUTROS PCT: 66 % (ref 43–77)
Platelets: 285 10*3/uL (ref 150–400)
RBC: 4.14 MIL/uL (ref 3.87–5.11)
RDW: 12.7 % (ref 11.5–15.5)
WBC: 7.3 10*3/uL (ref 4.0–10.5)

## 2013-08-11 LAB — HEPATIC FUNCTION PANEL
ALT: 17 U/L (ref 0–35)
AST: 18 U/L (ref 0–37)
Albumin: 4.2 g/dL (ref 3.5–5.2)
Alkaline Phosphatase: 76 U/L (ref 39–117)
BILIRUBIN DIRECT: 0.2 mg/dL (ref 0.0–0.3)
Indirect Bilirubin: 0.7 mg/dL (ref 0.2–1.2)
Total Bilirubin: 0.9 mg/dL (ref 0.2–1.2)
Total Protein: 7.4 g/dL (ref 6.0–8.3)

## 2013-08-11 LAB — MICROALBUMIN / CREATININE URINE RATIO
CREATININE, URINE: 98.1 mg/dL
Microalb Creat Ratio: 5.1 mg/g (ref 0.0–30.0)
Microalb, Ur: 0.5 mg/dL (ref 0.00–1.89)

## 2013-08-11 LAB — BASIC METABOLIC PANEL WITH GFR
BUN: 11 mg/dL (ref 6–23)
CO2: 23 mEq/L (ref 19–32)
Calcium: 9.1 mg/dL (ref 8.4–10.5)
Chloride: 102 mEq/L (ref 96–112)
Creat: 0.72 mg/dL (ref 0.50–1.10)
GLUCOSE: 91 mg/dL (ref 70–99)
POTASSIUM: 3.9 meq/L (ref 3.5–5.3)
SODIUM: 137 meq/L (ref 135–145)

## 2013-08-11 LAB — TSH: TSH: 1.407 u[IU]/mL (ref 0.350–4.500)

## 2013-08-11 LAB — LIPID PANEL
CHOL/HDL RATIO: 2.4 ratio
CHOLESTEROL: 185 mg/dL (ref 0–200)
HDL: 77 mg/dL (ref >39)
LDL Cholesterol: 89 mg/dL (ref 0–99)
TRIGLYCERIDES: 93 mg/dL (ref <150)
VLDL: 19 mg/dL (ref 0–40)

## 2013-08-11 LAB — HEMOGLOBIN A1C
Hgb A1c MFr Bld: 5.7 % — ABNORMAL HIGH (ref <5.7)
Mean Plasma Glucose: 117 mg/dL — ABNORMAL HIGH (ref <117)

## 2013-08-11 LAB — URINALYSIS, MICROSCOPIC ONLY
Bacteria, UA: NONE SEEN
Casts: NONE SEEN
Crystals: NONE SEEN
SQUAMOUS EPITHELIAL / LPF: NONE SEEN

## 2013-08-11 LAB — HEPATITIS B CORE ANTIBODY, TOTAL: HEP B C TOTAL AB: NONREACTIVE

## 2013-08-11 LAB — MAGNESIUM: Magnesium: 1.9 mg/dL (ref 1.5–2.5)

## 2013-08-11 LAB — HIV ANTIBODY (ROUTINE TESTING W REFLEX): HIV: NONREACTIVE

## 2013-08-11 LAB — HEPATITIS A ANTIBODY, TOTAL: HEP A TOTAL AB: NONREACTIVE

## 2013-08-11 LAB — HEPATITIS C ANTIBODY: HCV Ab: NEGATIVE

## 2013-08-11 LAB — INSULIN, FASTING: Insulin fasting, serum: 6 u[IU]/mL (ref 3–28)

## 2013-08-11 LAB — RPR

## 2013-08-11 LAB — VITAMIN B12: Vitamin B-12: 279 pg/mL (ref 211–911)

## 2013-08-11 LAB — HEPATITIS B SURFACE ANTIBODY,QUALITATIVE: Hep B S Ab: NEGATIVE

## 2013-08-11 LAB — VITAMIN D 25 HYDROXY (VIT D DEFICIENCY, FRACTURES): VIT D 25 HYDROXY: 77 ng/mL (ref 30–89)

## 2013-08-14 ENCOUNTER — Telehealth: Payer: Self-pay

## 2013-08-14 LAB — TB SKIN TEST
INDURATION: 0 mm
TB SKIN TEST: NEGATIVE

## 2013-08-14 LAB — HEPATITIS B E ANTIBODY: Hepatitis Be Antibody: NONREACTIVE

## 2013-08-14 NOTE — Telephone Encounter (Signed)
Message copied by Nadyne Coombes on Mon Aug 14, 2013  9:38 AM ------      Message from: Unk Pinto      Created: Sun Aug 13, 2013  3:54 PM       U/A nl/ok      B12 is very low normal range -       suggest you take a b12 supplement      Hep A B C & HIV/AIDS and RPR/Syphilis also both neg/ok      CBC Kidneys/electrolytes  Liver Magnesium - all nl/ok      Chol 185 / Good HDL Chol 77  High - great       Bad LDL Chol 89 low - great      Thyroid - nl/ok      A1c 5.7% - borderline - recommend avoid fried & greasy foods,  sweets/candy, white rice (brown or wild rice or Quinoa is OK), white potatoes (sweet potatoes are OK) - anything made from white flour - bagels, doughnuts, rolls, buns, biscuits,white and wheat breads, pizza crust and traditional pasta made of white flour & egg white(vegetarian pasta or spinach or wheat pasta is OK).  Multi-grain bread is OK - like multi-grain flat bread or sandwich thins. Avoid alcohol in excess. Exercise is also important.      Vit D 77 - excellent              ------

## 2013-08-14 NOTE — Telephone Encounter (Signed)
lmom to pt to return my call for lab results. 

## 2013-08-22 ENCOUNTER — Other Ambulatory Visit: Payer: Self-pay | Admitting: Internal Medicine

## 2013-08-22 DIAGNOSIS — R002 Palpitations: Secondary | ICD-10-CM

## 2013-09-04 ENCOUNTER — Other Ambulatory Visit: Payer: Self-pay | Admitting: Physician Assistant

## 2013-09-04 MED ORDER — LORAZEPAM 1 MG PO TABS: ORAL_TABLET | ORAL | Status: DC

## 2013-10-30 ENCOUNTER — Other Ambulatory Visit: Payer: Self-pay | Admitting: Emergency Medicine

## 2014-01-12 ENCOUNTER — Other Ambulatory Visit: Payer: Self-pay

## 2014-01-15 ENCOUNTER — Other Ambulatory Visit: Payer: Self-pay

## 2014-01-15 DIAGNOSIS — Z1239 Encounter for other screening for malignant neoplasm of breast: Secondary | ICD-10-CM

## 2014-01-25 ENCOUNTER — Other Ambulatory Visit: Payer: Self-pay

## 2014-01-25 ENCOUNTER — Ambulatory Visit
Admission: RE | Admit: 2014-01-25 | Discharge: 2014-01-25 | Disposition: A | Discharge status: Home / Self Care | Payer: 59 | Source: Ambulatory Visit

## 2014-01-25 DIAGNOSIS — Z1231 Encounter for screening mammogram for malignant neoplasm of breast: Secondary | ICD-10-CM

## 2014-01-29 ENCOUNTER — Encounter: Payer: Self-pay | Admitting: Internal Medicine

## 2014-03-14 ENCOUNTER — Other Ambulatory Visit: Payer: Self-pay | Admitting: Physician Assistant

## 2014-03-14 MED ORDER — LORAZEPAM 1 MG PO TABS
ORAL_TABLET | ORAL | Status: DC
Start: 2014-03-14 — End: 2014-06-27

## 2014-04-02 ENCOUNTER — Other Ambulatory Visit: Payer: Self-pay

## 2014-04-02 MED ORDER — NORGESTIM-ETH ESTRAD TRIPHASIC 0.18/0.215/0.25 MG-35 MCG PO TABS: ORAL_TABLET | ORAL | Status: DC

## 2014-04-04 ENCOUNTER — Other Ambulatory Visit: Payer: Self-pay | Admitting: Gynecology

## 2014-05-01 ENCOUNTER — Other Ambulatory Visit: Payer: Self-pay | Admitting: Internal Medicine

## 2014-05-01 MED ORDER — ACYCLOVIR 400 MG PO TABS: ORAL_TABLET | ORAL | Status: AC

## 2014-05-09 ENCOUNTER — Encounter: Payer: Self-pay | Admitting: Gynecology

## 2014-05-09 ENCOUNTER — Ambulatory Visit (INDEPENDENT_AMBULATORY_CARE_PROVIDER_SITE_OTHER): Payer: 59 | Admitting: Gynecology

## 2014-05-09 VITALS — BP 116/72 | Ht 63.0 in | Wt 137.0 lb

## 2014-05-09 DIAGNOSIS — Z01419 Encounter for gynecological examination (general) (routine) without abnormal findings: Secondary | ICD-10-CM

## 2014-05-09 MED ORDER — NORGESTIM-ETH ESTRAD TRIPHASIC 0.18/0.215/0.25 MG-35 MCG PO TABS: 1.0000 | ORAL_TABLET | Freq: Every day | ORAL | Status: DC

## 2014-05-09 NOTE — Progress Notes (Signed)
Tina Holmes 1971/03/21 [id number]        44 y.o.  G2P0011 for annual exam.  Doing well without complaints.  Past medical history,surgical history, problem list, medications, allergies, family history and social history were all reviewed and documented as reviewed in the EPIC chart.  ROS:  Performed with pertinent positives and negatives included in the history, assessment and plan.   Additional significant findings :  none   Exam: Kim Counsellor Vitals:   05/09/14 1023 05/09/14 1024  BP: 116/72 116/72  Height: 5\' 3"  (1.6 m) 5\' 3"  (1.6 m)  Weight: 137 lb (62.143 kg) 137 lb (62.143 kg)   General appearance:  Normal affect, orientation and appearance. Skin: Grossly normal HEENT: Without gross lesions.  No cervical or supraclavicular adenopathy. Thyroid normal.  Lungs:  Clear without wheezing, rales or rhonchi Cardiac: RR, without RMG Abdominal:  Soft, nontender, without masses, guarding, rebound, organomegaly or hernia Breasts:  Examined lying and sitting without masses, retractions, discharge or axillary adenopathy. Pelvic:  Ext/BUS/vagina normal  Cervix normal  Uterus anteverted, normal size, shape and contour, midline and mobile nontender   Adnexa  Without masses or tenderness    Anus and perineum  Normal   Rectovaginal  Normal sphincter tone without palpated masses or tenderness.    Assessment/Plan:  44 y.o. G66P0011 female for annual exam with regular menses, oral contraceptives.   1. Contraceptive management. Patient continues on oral contraceptives. I again discussed risks with advancing age and history of prior cigarette smoking years ago of stroke heart attack DVT. Patient doing well wants to continue. She clearly understands the issues and risks and I refilled her 1 year with Ortho Tri-Cyclen equivalent. 2. Pap smear/HPV negative 02/2012. No Pap smear done today. No history of abnormal Pap smears previously. 3. Mammography 12/2013. Continue with annual  mammography. SBE monthly reviewed. 4. Health maintenance. No routine blood work done as she reports it done at her primary physician's office. Follow up 1 year, sooner as needed.    Anastasio Auerbach MD, 10:50 AM 05/09/2014

## 2014-05-09 NOTE — Patient Instructions (Signed)
You may obtain a copy of any labs that were done today by logging onto MyChart as outlined in the instructions provided with your AVS (after visit summary). The office will not call with normal lab results but certainly if there are any significant abnormalities then we will contact you.   Health Maintenance, Female A healthy lifestyle and preventative care can promote health and wellness.  Maintain regular health, dental, and eye exams.  Eat a healthy diet. Foods like vegetables, fruits, whole grains, low-fat dairy products, and lean protein foods contain the nutrients you need without too many calories. Decrease your intake of foods high in solid fats, added sugars, and salt. Get information about a proper diet from your caregiver, if necessary.  Regular physical exercise is one of the most important things you can do for your health. Most adults should get at least 150 minutes of moderate-intensity exercise (any activity that increases your heart rate and causes you to sweat) each week. In addition, most adults need muscle-strengthening exercises on 2 or more days a week.   Maintain a healthy weight. The body mass index (BMI) is a screening tool to identify possible weight problems. It provides an estimate of body fat based on height and weight. Your caregiver can help determine your BMI, and can help you achieve or maintain a healthy weight. For adults 20 years and older:  A BMI below 18.5 is considered underweight.  A BMI of 18.5 to 24.9 is normal.  A BMI of 25 to 29.9 is considered overweight.  A BMI of 30 and above is considered obese.  Maintain normal blood lipids and cholesterol by exercising and minimizing your intake of saturated fat. Eat a balanced diet with plenty of fruits and vegetables. Blood tests for lipids and cholesterol should begin at age 61 and be repeated every 5 years. If your lipid or cholesterol levels are high, you are over 50, or you are a high risk for heart  disease, you may need your cholesterol levels checked more frequently.Ongoing high lipid and cholesterol levels should be treated with medicines if diet and exercise are not effective.  If you smoke, find out from your caregiver how to quit. If you do not use tobacco, do not start.  Lung cancer screening is recommended for adults aged 33 80 years who are at high risk for developing lung cancer because of a history of smoking. Yearly low-dose computed tomography (CT) is recommended for people who have at least a 30-pack-year history of smoking and are a current smoker or have quit within the past 15 years. A pack year of smoking is smoking an average of 1 pack of cigarettes a day for 1 year (for example: 1 pack a day for 30 years or 2 packs a day for 15 years). Yearly screening should continue until the smoker has stopped smoking for at least 15 years. Yearly screening should also be stopped for people who develop a health problem that would prevent them from having lung cancer treatment.  If you are pregnant, do not drink alcohol. If you are breastfeeding, be very cautious about drinking alcohol. If you are not pregnant and choose to drink alcohol, do not exceed 1 drink per day. One drink is considered to be 12 ounces (355 mL) of beer, 5 ounces (148 mL) of wine, or 1.5 ounces (44 mL) of liquor.  Avoid use of street drugs. Do not share needles with anyone. Ask for help if you need support or instructions about stopping  the use of drugs.  High blood pressure causes heart disease and increases the risk of stroke. Blood pressure should be checked at least every 1 to 2 years. Ongoing high blood pressure should be treated with medicines, if weight loss and exercise are not effective.  If you are 59 to 46 years old, ask your caregiver if you should take aspirin to prevent strokes.  Diabetes screening involves taking a blood sample to check your fasting blood sugar level. This should be done once every 3  years, after age 91, if you are within normal weight and without risk factors for diabetes. Testing should be considered at a younger age or be carried out more frequently if you are overweight and have at least 1 risk factor for diabetes.  Breast cancer screening is essential preventative care for women. You should practice "breast self-awareness." This means understanding the normal appearance and feel of your breasts and may include breast self-examination. Any changes detected, no matter how small, should be reported to a caregiver. Women in their 66s and 30s should have a clinical breast exam (CBE) by a caregiver as part of a regular health exam every 1 to 3 years. After age 101, women should have a CBE every year. Starting at age 100, women should consider having a mammogram (breast X-ray) every year. Women who have a family history of breast cancer should talk to their caregiver about genetic screening. Women at a high risk of breast cancer should talk to their caregiver about having an MRI and a mammogram every year.  Breast cancer gene (BRCA)-related cancer risk assessment is recommended for women who have family members with BRCA-related cancers. BRCA-related cancers include breast, ovarian, tubal, and peritoneal cancers. Having family members with these cancers may be associated with an increased risk for harmful changes (mutations) in the breast cancer genes BRCA1 and BRCA2. Results of the assessment will determine the need for genetic counseling and BRCA1 and BRCA2 testing.  The Pap test is a screening test for cervical cancer. Women should have a Pap test starting at age 57. Between ages 25 and 35, Pap tests should be repeated every 2 years. Beginning at age 37, you should have a Pap test every 3 years as long as the past 3 Pap tests have been normal. If you had a hysterectomy for a problem that was not cancer or a condition that could lead to cancer, then you no longer need Pap tests. If you are  between ages 50 and 76, and you have had normal Pap tests going back 10 years, you no longer need Pap tests. If you have had past treatment for cervical cancer or a condition that could lead to cancer, you need Pap tests and screening for cancer for at least 20 years after your treatment. If Pap tests have been discontinued, risk factors (such as a new sexual partner) need to be reassessed to determine if screening should be resumed. Some women have medical problems that increase the chance of getting cervical cancer. In these cases, your caregiver may recommend more frequent screening and Pap tests.  The human papillomavirus (HPV) test is an additional test that may be used for cervical cancer screening. The HPV test looks for the virus that can cause the cell changes on the cervix. The cells collected during the Pap test can be tested for HPV. The HPV test could be used to screen women aged 44 years and older, and should be used in women of any age  who have unclear Pap test results. After the age of 55, women should have HPV testing at the same frequency as a Pap test.  Colorectal cancer can be detected and often prevented. Most routine colorectal cancer screening begins at the age of 44 and continues through age 20. However, your caregiver may recommend screening at an earlier age if you have risk factors for colon cancer. On a yearly basis, your caregiver may provide home test kits to check for hidden blood in the stool. Use of a small camera at the end of a tube, to directly examine the colon (sigmoidoscopy or colonoscopy), can detect the earliest forms of colorectal cancer. Talk to your caregiver about this at age 86, when routine screening begins. Direct examination of the colon should be repeated every 5 to 10 years through age 13, unless early forms of pre-cancerous polyps or small growths are found.  Hepatitis C blood testing is recommended for all people born from [birth year] through 1967 and any  individual with known risks for hepatitis C.  Practice safe sex. Use condoms and avoid high-risk sexual practices to reduce the spread of sexually transmitted infections (STIs). Sexually active women aged 36 and younger should be checked for Chlamydia, which is a common sexually transmitted infection. Older women with new or multiple partners should also be tested for Chlamydia. Testing for other STIs is recommended if you are sexually active and at increased risk.  Osteoporosis is a disease in which the bones lose minerals and strength with aging. This can result in serious bone fractures. The risk of osteoporosis can be identified using a bone density scan. Women ages 20 and over and women at risk for fractures or osteoporosis should discuss screening with their caregivers. Ask your caregiver whether you should be taking a calcium supplement or vitamin D to reduce the rate of osteoporosis.  Menopause can be associated with physical symptoms and risks. Hormone replacement therapy is available to decrease symptoms and risks. You should talk to your caregiver about whether hormone replacement therapy is right for you.  Use sunscreen. Apply sunscreen liberally and repeatedly throughout the day. You should seek shade when your shadow is shorter than you. Protect yourself by wearing long sleeves, pants, a wide-brimmed hat, and sunglasses year round, whenever you are outdoors.  Notify your caregiver of new moles or changes in moles, especially if there is a change in shape or color. Also notify your caregiver if a mole is larger than the size of a pencil eraser.  Stay current with your immunizations. Document Released: 09/29/2010 Document Revised: 07/11/2012 Document Reviewed: 09/29/2010 [facility] Patient Information 2014 [employer].

## 2014-05-10 LAB — URINALYSIS W MICROSCOPIC + REFLEX CULTURE
Bacteria, UA: NONE SEEN
Bilirubin Urine: NEGATIVE
Casts: NONE SEEN
Crystals: NONE SEEN
Glucose, UA: NEGATIVE mg/dL
KETONES UR: 15 mg/dL — AB
LEUKOCYTES UA: NEGATIVE
NITRITE: NEGATIVE
Protein, ur: NEGATIVE mg/dL
Specific Gravity, Urine: 1.016 (ref 1.005–1.030)
Squamous Epithelial / LPF: NONE SEEN
UROBILINOGEN UA: 0.2 mg/dL (ref 0.0–1.0)
pH: 5 (ref 5.0–8.0)

## 2014-05-25 ENCOUNTER — Other Ambulatory Visit: Payer: Self-pay | Admitting: Gynecology

## 2014-06-27 ENCOUNTER — Telehealth: Payer: Self-pay | Admitting: Physician Assistant

## 2014-06-27 MED ORDER — LORAZEPAM 1 MG PO TABS: ORAL_TABLET | ORAL | Status: DC

## 2014-08-14 ENCOUNTER — Encounter: Payer: Self-pay | Admitting: Internal Medicine

## 2014-08-14 ENCOUNTER — Other Ambulatory Visit: Payer: Self-pay | Admitting: Internal Medicine

## 2014-08-14 ENCOUNTER — Ambulatory Visit (INDEPENDENT_AMBULATORY_CARE_PROVIDER_SITE_OTHER): Payer: 59 | Admitting: Internal Medicine

## 2014-08-14 VITALS — BP 104/68 | HR 84 | Temp 97.5°F | Resp 16 | Ht 63.0 in | Wt 133.6 lb

## 2014-08-14 DIAGNOSIS — Z79899 Other long term (current) drug therapy: Secondary | ICD-10-CM

## 2014-08-14 DIAGNOSIS — R5383 Other fatigue: Secondary | ICD-10-CM

## 2014-08-14 DIAGNOSIS — E559 Vitamin D deficiency, unspecified: Secondary | ICD-10-CM

## 2014-08-14 DIAGNOSIS — E7800 Pure hypercholesterolemia, unspecified: Secondary | ICD-10-CM

## 2014-08-14 DIAGNOSIS — Z23 Encounter for immunization: Secondary | ICD-10-CM

## 2014-08-14 DIAGNOSIS — K219 Gastro-esophageal reflux disease without esophagitis: Secondary | ICD-10-CM

## 2014-08-14 DIAGNOSIS — K589 Irritable bowel syndrome without diarrhea: Secondary | ICD-10-CM

## 2014-08-14 DIAGNOSIS — Z1211 Encounter for screening for malignant neoplasm of colon: Secondary | ICD-10-CM | POA: Insufficient documentation

## 2014-08-14 DIAGNOSIS — IMO0001 Reserved for inherently not codable concepts without codable children: Secondary | ICD-10-CM

## 2014-08-14 DIAGNOSIS — R030 Elevated blood-pressure reading, without diagnosis of hypertension: Secondary | ICD-10-CM

## 2014-08-14 DIAGNOSIS — G43809 Other migraine, not intractable, without status migrainosus: Secondary | ICD-10-CM

## 2014-08-14 DIAGNOSIS — R7309 Other abnormal glucose: Secondary | ICD-10-CM

## 2014-08-14 DIAGNOSIS — Z111 Encounter for screening for respiratory tuberculosis: Secondary | ICD-10-CM

## 2014-08-14 DIAGNOSIS — Z1212 Encounter for screening for malignant neoplasm of rectum: Secondary | ICD-10-CM

## 2014-08-14 LAB — BASIC METABOLIC PANEL WITH GFR
BUN: 10 mg/dL (ref 6–23)
CALCIUM: 8.9 mg/dL (ref 8.4–10.5)
CO2: 24 mEq/L (ref 19–32)
Chloride: 99 mEq/L (ref 96–112)
Creat: 0.69 mg/dL (ref 0.50–1.10)
GFR, Est Non African American: >89 mL/min
GLUCOSE: 86 mg/dL (ref 70–99)
POTASSIUM: 4 meq/L (ref 3.5–5.3)
Sodium: 136 mEq/L (ref 135–145)

## 2014-08-14 LAB — VITAMIN B12: Vitamin B-12: 340 pg/mL (ref 211–911)

## 2014-08-14 LAB — HEPATIC FUNCTION PANEL
ALT: 21 U/L (ref 0–35)
AST: 18 U/L (ref 0–37)
Albumin: 3.8 g/dL (ref 3.5–5.2)
Alkaline Phosphatase: 70 U/L (ref 39–117)
Bilirubin, Direct: 0.3 mg/dL (ref 0.0–0.3)
Indirect Bilirubin: 0.7 mg/dL (ref 0.2–1.2)
TOTAL PROTEIN: 6.9 g/dL (ref 6.0–8.3)
Total Bilirubin: 1 mg/dL (ref 0.2–1.2)

## 2014-08-14 LAB — CBC WITH DIFFERENTIAL/PLATELET
Basophils Absolute: 0 10*3/uL (ref 0.0–0.1)
Basophils Relative: 0 % (ref 0–1)
EOS ABS: 0.1 10*3/uL (ref 0.0–0.7)
Eosinophils Relative: 1 % (ref 0–5)
HCT: 38.9 % (ref 36.0–46.0)
Hemoglobin: 13.1 g/dL (ref 12.0–15.0)
LYMPHS ABS: 1.4 10*3/uL (ref 0.7–4.0)
Lymphocytes Relative: 21 % (ref 12–46)
MCH: 31 pg (ref 26.0–34.0)
MCHC: 33.7 g/dL (ref 30.0–36.0)
MCV: 92 fL (ref 78.0–100.0)
MPV: 10 fL (ref 8.6–12.4)
Monocytes Absolute: 0.5 10*3/uL (ref 0.1–1.0)
Monocytes Relative: 7 % (ref 3–12)
Neutro Abs: 4.8 10*3/uL (ref 1.7–7.7)
Neutrophils Relative %: 71 % (ref 43–77)
PLATELETS: 296 10*3/uL (ref 150–400)
RBC: 4.23 MIL/uL (ref 3.87–5.11)
RDW: 12.7 % (ref 11.5–15.5)
WBC: 6.7 10*3/uL (ref 4.0–10.5)

## 2014-08-14 LAB — MAGNESIUM: MAGNESIUM: 1.8 mg/dL (ref 1.5–2.5)

## 2014-08-14 LAB — LIPID PANEL
Cholesterol: 172 mg/dL (ref 0–200)
HDL: 67 mg/dL (ref >=46)
LDL CALC: 82 mg/dL (ref 0–99)
Total CHOL/HDL Ratio: 2.6 Ratio
Triglycerides: 114 mg/dL (ref <150)
VLDL: 23 mg/dL (ref 0–40)

## 2014-08-14 LAB — TSH: TSH: 2.181 u[IU]/mL (ref 0.350–4.500)

## 2014-08-14 LAB — HEMOGLOBIN A1C
HEMOGLOBIN A1C: 5.7 % — AB (ref <5.7)
Mean Plasma Glucose: 117 mg/dL — ABNORMAL HIGH (ref <117)

## 2014-08-14 LAB — IRON AND TIBC
%SAT: 25 % (ref 20–55)
Iron: 91 ug/dL (ref 42–145)
TIBC: 364 ug/dL (ref 250–470)
UIBC: 273 ug/dL (ref 125–400)

## 2014-08-14 NOTE — Patient Instructions (Signed)
Recommend Low dose or baby Aspirin 81 mg daily   To reduce risk of Colon Cancer 20 %, Skin Cancer 26 % , Melanoma 46% and   Pancreatic cancer 60%  ++++++++++++++++++ Vitamin D goal is between 70-100.   Please make sure that you are taking your Vitamin D as directed.   It is very important as a natural antiinflammatory   helping hair, skin, and nails, as well as reducing stroke and heart attack risk.   It helps your bones and helps with mood.  It also decreases numerous cancer risks so please take it as directed.   Low Vit D is associated with a 200-300% higher risk for CANCER   and 200-300% higher risk for HEART   ATTACK  &  STROKE.    ......................................  It is also associated with higher death rate at younger ages,   autoimmune diseases like Rheumatoid arthritis, Lupus, Multiple Sclerosis.     Also many other serious conditions, like depression, Alzheimer's  Dementia, infertility, muscle aches, fatigue, fibromyalgia - just to name a few.  +++++++++++++++++++    Recommend the book "The END of DIETING" by Dr Joel Fuhrman   & the book "The END of DIABETES " by Dr Joel Fuhrman  At [employer] - get book & Audio CD's     Being diabetic has a  300% increased risk for heart attack, stroke, cancer, and alzheimer- type vascular dementia. It is very important that you work harder with diet by avoiding all foods that are white. Avoid white rice (brown & wild rice is OK), white potatoes (sweetpotatoes in moderation is OK), White bread or wheat bread or anything made out of white flour like bagels, donuts, rolls, buns, biscuits, cakes, pastries, cookies, pizza crust, and pasta (made from white flour & egg whites) - vegetarian pasta or spinach or wheat pasta is OK. Multigrain breads like Arnold's or [employer], or multigrain sandwich thins or flatbreads.  Diet, exercise and weight loss can reverse and cure diabetes in the early stages.  Diet, exercise and weight  loss is very important in the control and prevention of complications of diabetes which affects every system in your body, ie. Brain - dementia/stroke, eyes - glaucoma/blindness, heart - heart attack/heart failure, kidneys - dialysis, stomach - gastric paralysis, intestines - malabsorption, nerves - severe painful neuritis, circulation - gangrene & loss of a leg(s), and finally cancer and Alzheimers.    I recommend avoid fried & greasy foods,  sweets/candy, white rice (brown or wild rice or Quinoa is OK), white potatoes (sweet potatoes are OK) - anything made from white flour - bagels, doughnuts, rolls, buns, biscuits,white and wheat breads, pizza crust and traditional pasta made of white flour & egg white(vegetarian pasta or spinach or wheat pasta is OK).  Multi-grain bread is OK - like multi-grain flat bread or sandwich thins. Avoid alcohol in excess. Exercise is also important.    Eat all the vegetables you want - avoid meat, especially red meat and dairy - especially cheese.  Cheese is the most concentrated form of trans-fats which is the worst thing to clog up our arteries. Veggie cheese is OK which can be found in the fresh produce section at [employer] or [employer] or [employer]  ++++++++++++++++++++++++++  Preventive Care for Adults  A healthy lifestyle and preventive care can promote health and wellness. Preventive health guidelines for women include the following key practices.  A routine yearly physical is a good way to check with your health care   provider about your health and preventive screening. It is a chance to share any concerns and updates on your health and to receive a thorough exam.  Visit your dentist for a routine exam and preventive care every 6 months. Brush your teeth twice a day and floss once a day. Good oral hygiene prevents tooth decay and gum disease.  The frequency of eye exams is based on your age, health, family medical history, use of contact lenses, and other  factors. Follow your health care provider's recommendations for frequency of eye exams.  Eat a healthy diet. Foods like vegetables, fruits, whole grains, low-fat dairy products, and lean protein foods contain the nutrients you need without too many calories. Decrease your intake of foods high in solid fats, added sugars, and salt. Eat the right amount of calories for you.Get information about a proper diet from your health care provider, if necessary.  Regular physical exercise is one of the most important things you can do for your health. Most adults should get at least 150 minutes of moderate-intensity exercise (any activity that increases your heart rate and causes you to sweat) each week. In addition, most adults need muscle-strengthening exercises on 2 or more days a week.  Maintain a healthy weight. The body mass index (BMI) is a screening tool to identify possible weight problems. It provides an estimate of body fat based on height and weight. Your health care provider can find your BMI and can help you achieve or maintain a healthy weight.For adults 20 years and older:  A BMI below 18.5 is considered underweight.  A BMI of 18.5 to 24.9 is normal.  A BMI of 25 to 29.9 is considered overweight.  A BMI of 30 and above is considered obese.  Maintain normal blood lipids and cholesterol levels by exercising and minimizing your intake of saturated fat. Eat a balanced diet with plenty of fruit and vegetables. Blood tests for lipids and cholesterol should begin at age 51 and be repeated every 5 years. If your lipid or cholesterol levels are high, you are over 50, or you are at high risk for heart disease, you may need your cholesterol levels checked more frequently.Ongoing high lipid and cholesterol levels should be treated with medicines if diet and exercise are not working.  If you smoke, find out from your health care provider how to quit. If you do not use tobacco, do not start.  Lung  cancer screening is recommended for adults aged 85-80 years who are at high risk for developing lung cancer because of a history of smoking. A yearly low-dose CT scan of the lungs is recommended for people who have at least a 30-pack-year history of smoking and are a current smoker or have quit within the past 15 years. A pack year of smoking is smoking an average of 1 pack of cigarettes a day for 1 year (for example: 1 pack a day for 30 years or 2 packs a day for 15 years). Yearly screening should continue until the smoker has stopped smoking for at least 15 years. Yearly screening should be stopped for people who develop a health problem that would prevent them from having lung cancer treatment.  High blood pressure causes heart disease and increases the risk of stroke. Your blood pressure should be checked at least every 1 to 2 years. Ongoing high blood pressure should be treated with medicines if weight loss and exercise do not work.  If you are 9-35 years old, ask your  health care provider if you should take aspirin to prevent strokes.  Diabetes screening involves taking a blood sample to check your fasting blood sugar level. This should be done once every 3 years, after age 40, if you are within normal weight and without risk factors for diabetes. Testing should be considered at a younger age or be carried out more frequently if you are overweight and have at least 1 risk factor for diabetes.  Breast cancer screening is essential preventive care for women. You should practice "breast self-awareness." This means understanding the normal appearance and feel of your breasts and may include breast self-examination. Any changes detected, no matter how small, should be reported to a health care provider. Women in their 43s and 30s should have a clinical breast exam (CBE) by a health care provider as part of a regular health exam every 1 to 3 years. After age 39, women should have a CBE every year. Starting  at age 93, women should consider having a mammogram (breast X-ray test) every year. Women who have a family history of breast cancer should talk to their health care provider about genetic screening. Women at a high risk of breast cancer should talk to their health care providers about having an MRI and a mammogram every year.  Breast cancer gene (BRCA)-related cancer risk assessment is recommended for women who have family members with BRCA-related cancers. BRCA-related cancers include breast, ovarian, tubal, and peritoneal cancers. Having family members with these cancers may be associated with an increased risk for harmful changes (mutations) in the breast cancer genes BRCA1 and BRCA2. Results of the assessment will determine the need for genetic counseling and BRCA1 and BRCA2 testing.  Routine pelvic exams to screen for cancer are no longer recommended for nonpregnant women who are considered low risk for cancer of the pelvic organs (ovaries, uterus, and vagina) and who do not have symptoms. Ask your health care provider if a screening pelvic exam is right for you.  If you have had past treatment for cervical cancer or a condition that could lead to cancer, you need Pap tests and screening for cancer for at least 20 years after your treatment. If Pap tests have been discontinued, your risk factors (such as having a new sexual partner) need to be reassessed to determine if screening should be resumed. Some women have medical problems that increase the chance of getting cervical cancer. In these cases, your health care provider may recommend more frequent screening and Pap tests.  Colorectal cancer can be detected and often prevented. Most routine colorectal cancer screening begins at the age of 57 years and continues through age 17 years. However, your health care provider may recommend screening at an earlier age if you have risk factors for colon cancer. On a yearly basis, your health care provider may  provide home test kits to check for hidden blood in the stool. Use of a small camera at the end of a tube, to directly examine the colon (sigmoidoscopy or colonoscopy), can detect the earliest forms of colorectal cancer. Talk to your health care provider about this at age 73, when routine screening begins. Direct exam of the colon should be repeated every 5-10 years through age 55 years, unless early forms of pre-cancerous polyps or small growths are found.  Hepatitis C blood testing is recommended for all people born from [birth year] through 1965 and any individual with known risks for hepatitis C.  Pra  Osteoporosis is a disease in which the  bones lose minerals and strength with aging. This can result in serious bone fractures or breaks. The risk of osteoporosis can be identified using a bone density scan. Women ages 65 years and over and women at risk for fractures or osteoporosis should discuss screening with their health care providers. Ask your health care provider whether you should take a calcium supplement or vitamin D to reduce the rate of osteoporosis.  Menopause can be associated with physical symptoms and risks. Hormone replacement therapy is available to decrease symptoms and risks. You should talk to your health care provider about whether hormone replacement therapy is right for you.  Use sunscreen. Apply sunscreen liberally and repeatedly throughout the day. You should seek shade when your shadow is shorter than you. Protect yourself by wearing long sleeves, pants, a wide-brimmed hat, and sunglasses year round, whenever you are outdoors.  Once a month, do a whole body skin exam, using a mirror to look at the skin on your back. Tell your health care provider of new moles, moles that have irregular borders, moles that are larger than a pencil eraser, or moles that have changed in shape or color.  Stay current with required vaccines (immunizations).  Influenza vaccine. All adults should be  immunized every year.  Tetanus, diphtheria, and acellular pertussis (Td, Tdap) vaccine. Pregnant women should receive 1 dose of Tdap vaccine during each pregnancy. The dose should be obtained regardless of the length of time since the last dose. Immunization is preferred during the 27th-36th week of gestation. An adult who has not previously received Tdap or who does not know her vaccine status should receive 1 dose of Tdap. This initial dose should be followed by tetanus and diphtheria toxoids (Td) booster doses every 10 years. Adults with an unknown or incomplete history of completing a 3-dose immunization series with Td-containing vaccines should begin or complete a primary immunization series including a Tdap dose. Adults should receive a Td booster every 10 years.  Varicella vaccine. An adult without evidence of immunity to varicella should receive 2 doses or a second dose if she has previously received 1 dose. Pregnant females who do not have evidence of immunity should receive the first dose after pregnancy. This first dose should be obtained before leaving the health care facility. The second dose should be obtained 4-8 weeks after the first dose.  Human papillomavirus (HPV) vaccine. Females aged 13-26 years who have not received the vaccine previously should obtain the 3-dose series. The vaccine is not recommended for use in pregnant females. However, pregnancy testing is not needed before receiving a dose. If a female is found to be pregnant after receiving a dose, no treatment is needed. In that case, the remaining doses should be delayed until after the pregnancy. Immunization is recommended for any person with an immunocompromised condition through the age of 26 years if she did not get any or all doses earlier. During the 3-dose series, the second dose should be obtained 4-8 weeks after the first dose. The third dose should be obtained 24 weeks after the first dose and 16 weeks after the second  dose.  Zoster vaccine. One dose is recommended for adults aged 60 years or older unless certain conditions are present.  Measles, mumps, and rubella (MMR) vaccine. Adults born before 1957 generally are considered immune to measles and mumps. Adults born in 1957 or later should have 1 or more doses of MMR vaccine unless there is a contraindication to the vaccine or there is   laboratory evidence of immunity to each of the three diseases. A routine second dose of MMR vaccine should be obtained at least 28 days after the first dose for students attending postsecondary schools, health care workers, or international travelers. People who received inactivated measles vaccine or an unknown type of measles vaccine during 1963-1967 should receive 2 doses of MMR vaccine. People who received inactivated mumps vaccine or an unknown type of mumps vaccine before 1979 and are at high risk for mumps infection should consider immunization with 2 doses of MMR vaccine. For females of childbearing age, rubella immunity should be determined. If there is no evidence of immunity, females who are not pregnant should be vaccinated. If there is no evidence of immunity, females who are pregnant should delay immunization until after pregnancy. Unvaccinated health care workers born before 1957 who lack laboratory evidence of measles, mumps, or rubella immunity or laboratory confirmation of disease should consider measles and mumps immunization with 2 doses of MMR vaccine or rubella immunization with 1 dose of MMR vaccine.  Pneumococcal 13-valent conjugate (PCV13) vaccine. When indicated, a person who is uncertain of her immunization history and has no record of immunization should receive the PCV13 vaccine. An adult aged 19 years or older who has certain medical conditions and has not been previously immunized should receive 1 dose of PCV13 vaccine. This PCV13 should be followed with a dose of pneumococcal polysaccharide (PPSV23) vaccine.  The PPSV23 vaccine dose should be obtained at least 8 weeks after the dose of PCV13 vaccine. An adult aged 19 years or older who has certain medical conditions and previously received 1 or more doses of PPSV23 vaccine should receive 1 dose of PCV13. The PCV13 vaccine dose should be obtained 1 or more years after the last PPSV23 vaccine dose.    Pneumococcal polysaccharide (PPSV23) vaccine. When PCV13 is also indicated, PCV13 should be obtained first. All adults aged 65 years and older should be immunized. An adult younger than age 65 years who has certain medical conditions should be immunized. Any person who resides in a nursing home or long-term care facility should be immunized. An adult smoker should be immunized. People with an immunocompromised condition and certain other conditions should receive both PCV13 and PPSV23 vaccines. People with human immunodeficiency virus (HIV) infection should be immunized as soon as possible after diagnosis. Immunization during chemotherapy or radiation therapy should be avoided. Routine use of PPSV23 vaccine is not recommended for American Indians, [place] Natives, or people younger than 65 years unless there are medical conditions that require PPSV23 vaccine. When indicated, people who have unknown immunization and have no record of immunization should receive PPSV23 vaccine. One-time revaccination 5 years after the first dose of PPSV23 is recommended for people aged 19-64 years who have chronic kidney failure, nephrotic syndrome, asplenia, or immunocompromised conditions. People who received 1-2 doses of PPSV23 before age 65 years should receive another dose of PPSV23 vaccine at age 65 years or later if at least 5 years have passed since the previous dose. Doses of PPSV23 are not needed for people immunized with PPSV23 at or after age 65 years.  Preventive Services / Frequency   Ages 40 to 64 years  Blood pressure check.  Lipid and cholesterol check.  Lung  cancer screening. / Every year if you are aged 55-80 years and have a 30-pack-year history of smoking and currently smoke or have quit within the past 15 years. Yearly screening is stopped once you have quit smoking for at   least 15 years or develop a health problem that would prevent you from having lung cancer treatment.  Clinical breast exam.** / Every year after age 40 years.  BRCA-related cancer risk assessment.** / For women who have family members with a BRCA-related cancer (breast, ovarian, tubal, or peritoneal cancers).  Mammogram.** / Every year beginning at age 40 years and continuing for as long as you are in good health. Consult with your health care provider.  Pap test.** / Every 3 years starting at age 30 years through age 65 or 70 years with a history of 3 consecutive normal Pap tests.  HPV screening.** / Every 3 years from ages 30 years through ages 65 to 70 years with a history of 3 consecutive normal Pap tests.  Fecal occult blood test (FOBT) of stool. / Every year beginning at age 50 years and continuing until age 75 years. You may not need to do this test if you get a colonoscopy every 10 years.  Flexible sigmoidoscopy or colonoscopy.** / Every 5 years for a flexible sigmoidoscopy or every 10 years for a colonoscopy beginning at age 50 years and continuing until age 75 years.  Hepatitis C blood test.** / For all people born from 1945 through 1965 and any individual with known risks for hepatitis C.  Skin self-exam. / Monthly.  Influenza vaccine. / Every year.  Tetanus, diphtheria, and acellular pertussis (Tdap/Td) vaccine.** / Consult your health care provider. Pregnant women should receive 1 dose of Tdap vaccine during each pregnancy. 1 dose of Td every 10 years.  Varicella vaccine.** / Consult your health care provider. Pregnant females who do not have evidence of immunity should receive the first dose after pregnancy.  Zoster vaccine.** / 1 dose for adults aged 60  years or older.  Pneumococcal 13-valent conjugate (PCV13) vaccine.** / Consult your health care provider.  Pneumococcal polysaccharide (PPSV23) vaccine.** / 1 to 2 doses if you smoke cigarettes or if you have certain conditions.  Meningococcal vaccine.** / Consult your health care provider.  Hepatitis A vaccine.** / Consult your health care provider.  Hepatitis B vaccine.** / Consult your health care provider. Screening for abdominal aortic aneurysm (AAA)  by ultrasound is recommended for people over 50 who have history of high blood pressure or who are current or former smokers. 

## 2014-08-14 NOTE — Progress Notes (Signed)
Patient ID: Tina Holmes, female   DOB: [birth year], 44 y.o.   MRN: [id number]   Annual Screening Comprehensive Examination   This very nice 44 y.o. MWF.  presents for complete physical.  Patient has no major health issues. She does present for screening for elevated BP and denies any c/o dizziness, CP, palpitations, dyspnea or edema. He does have hx/o mixed vascular & tension or muscle contraction type HA's which have been infrequent. She does have hx/o abnormal glucoses with elevated A1c 5.7% in 2011, 5.8% in 2012 and in 2014 A1c was 5.5%, but 1in May 2015 A1c was back up to 5.7% again. She's had no sx's of reactive hypoglycemia, diabetic polys, paresthesias or visual blurring. Screening for lipids in the past have found no significant abnormalities.  Finally, patient has history of Vitamin D Deficiency  Of 40 in 2009 and last vitamin D was 86 in May 2015.   Patient does have hx/o GERD which has relatively Asx over the last year, but she has been more sx with her IBS-D and not controlled well with her Dicyclomine during stressful times at work.    Medication Sig  . acyclovir (ZOVIRAX) 400 MG tablet Take 1 capsule     2 x day for cold sores and fever blisters  . Cholecalciferol (VITAMIN D PO) Take 6,000 Units by mouth.    . dicyclomine (BENTYL) 10 MG capsule TAKE 1 - 2 TABLETS BY MOUTH 4 TIMES A DAY AS NEEDED  . LORazepam (ATIVAN) 1 MG tablet TAKE 1 TABLET AT BEDTIME .Marland KitchenMarland Kitchen OCCASSIONALLY TWICE A DAY  . ORTHO TRI-CYCLEN, 28, 0.18/0.215/0.25 MG-35 MCG tablet TAKE 1 TABLET BY MOUTH DAILY.   Allergies  Allergen Reactions  . Imitrex [Sumatriptan]     Throat swelling  . Celexa [Citalopram]     Decreased libido  . Codeine     N/V  . Zoloft [Sertraline Hcl]     N/V   Past Medical History  Diagnosis Date  . Restless leg syndrome   . Depression   . GERD (gastroesophageal reflux disease)   . IBS (irritable bowel syndrome)   . Migraines   . Anxiety    Immunization History  Administered  Date(s) Administered  . Influenza Split 01/14/2011, 02/16/2013  . Influenza-Unspecified 02/11/2014  . PPD Test 08/10/2013, 08/14/2014  . Pneumococcal-Unspecified 07/29/2010  . Td 03/31/2003  . Tdap 08/14/2014   Past Surgical History  Procedure Laterality Date  . Termination of pregnanc      18 WEEKS--DOWNS SYNDROME  . Cholecystectomy     Family History  Problem Relation Age of Onset  . Diabetes Mother   . Hypertension Mother   . Heart attack Mother   . Hypertension Father   . CVA Maternal Grandfather     History   Social History  . Marital Status: Married    Spouse Name: N/A  . Number of Children: N/A  . Years of Education: N/A   Occupational History  . 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   Social History Main Topics  . Smoking status: Former Smoker    Quit date: 03/30/1996  . Smokeless tobacco: Never Used  . Alcohol Use: 0.0 oz/week    0 Standard drinks or equivalent per week     Comment: OCC  . Drug Use: No  . Sexual Activity: Yes    Birth Control/ Protection: Pill     Comment: 1st intercourse 44 yo-Fewer than 5 partners    ROS Constitutional: Denies fever, chills, weight loss/gain, headaches, insomnia, fatigue, night  sweats, and change in appetite. Eyes: Denies redness, blurred vision, diplopia, discharge, itchy, watery eyes.  ENT: Denies discharge, congestion, post nasal drip, epistaxis, sore throat, earache, hearing loss, dental pain, Tinnitus, Vertigo, Sinus pain, snoring.  Cardio: Denies chest pain, palpitations, irregular heartbeat, syncope, dyspnea, diaphoresis, orthopnea, PND, claudication, edema Respiratory: denies cough, dyspnea, DOE, pleurisy, hoarseness, laryngitis, wheezing.  Gastrointestinal: Denies dysphagia, heartburn, reflux, water brash, pain, cramps, nausea, vomiting, bloating, diarrhea, constipation, hematemesis, melena, hematochezia, jaundice, hemorrhoids Genitourinary: Denies dysuria, frequency, urgency, nocturia, hesitancy, discharge, hematuria, flank  pain Breast: Breast lumps, nipple discharge, bleeding.  Musculoskeletal: Denies arthralgia, myalgia, stiffness, Jt. Swelling, pain, limp, and strain/sprain. Skin: Denies puritis, rash, hives, warts, acne, eczema, changing in skin lesion Neuro: Weakness, tremor, incoordination, spasms, paresthesia, pain Psychiatric: Denies confusion, memory loss, sensory loss. Endocrine: Denies change in weight, skin, hair change, nocturia, and paresthesia, diabetic polys, visual blurring, hyper /hypo glycemic episodes.  Heme/Lymph: No excessive bleeding, bruising, enlarged lymph nodes.   Physical Exam  BP 104/68 mmHg  Pulse 84  Temp 97.5 F   Resp 16  Ht 5\' 3"    Wt 133 lb 9.6 oz     BMI 23.67   General Appearance: Well nourished and in no apparent distress. Eyes: PERRLA, EOMs, conjunctiva no swelling or erythema, normal fundi and vessels. Sinuses: No frontal/maxillary tenderness ENT/Mouth: EACs patent / TMs  nl. Nares clear without erythema, swelling, mucoid exudates. Oral hygiene is good. No erythema, swelling, or exudate. Tongue normal, non-obstructing. Tonsils not swollen or erythematous. Hearing normal.  Neck: Supple, thyroid normal. No bruits, nodes or JVD. Respiratory: Respiratory effort normal.  BS equal and clear bilateral without rales, rhonci, wheezing or stridor. Cardio: Heart sounds are normal with regular rate and rhythm and no murmurs, rubs or gallops. Peripheral pulses are normal and equal bilaterally without edema. No aortic or femoral bruits. Chest: symmetric with normal excursions and percussion. Breasts: Symmetric, without lumps, nipple discharge, retractions, or fibrocystic changes.  Abdomen: Flat, soft, with bowl sounds. Nontender, no guarding, rebound, hernias, masses, or organomegaly.  Lymphatics: Non tender without lymphadenopathy.  Genitourinary:  Musculoskeletal: Full ROM all peripheral extremities, joint stability, 5/5 strength, and normal gait. Skin: Warm and dry without  rashes, lesions, cyanosis, clubbing or  ecchymosis.  Neuro: Cranial nerves intact, reflexes equal bilaterally. Normal muscle tone, no cerebellar symptoms. Sensation intact.  Pysch: Awake and oriented X 3, normal affect, Insight and Judgment appropriate.   Assessment and Plan  1. Elevated BP screening  - Microalbumin / creatinine urine ratio - EKG 12-Lead  2. Elevated cholesterol, screening  - Lipid panel  3. Elevated glucose  - Hemoglobin A1c - Insulin, random  4. Vitamin D deficiency  - Vit D  25 hydroxy   5. Gastroesophageal reflux disease   6. IBS (irritable bowel syndrome)  - Recc try Loperamide up to 2-4 tablets bid as need   - also discussed food diary to try to identify if any food triggers  7. Migraine   8. Screening for rectal cancer  - POC Hemoccult Bld/Stl   9. Other fatigue  - Vitamin B12 - Iron and TIBC - TSH  10. Medication management  - CBC with Differential/Platelet - BASIC METABOLIC PANEL WITH GFR - Urine Microscopic - Hepatic function panel - Magnesium  11. Screening examination for pulmonary tuberculosis  - PPD  12. Need for prophylactic vaccination with combined diphtheria-tetanus-pertussis (DTP) vaccine  - Tdap vaccine greater than or equal to 7yo IM  Continue prudent diet as discussed, weight control, regular exercise,  and medications. Routine screening labs and tests as requested with regular follow-up as recommended.  Over 40 minutes of exam, counseling, chart review and critical decision making was performed

## 2014-08-15 LAB — URINALYSIS, MICROSCOPIC ONLY
Bacteria, UA: NONE SEEN
CASTS: NONE SEEN
CRYSTALS: NONE SEEN
SQUAMOUS EPITHELIAL / LPF: NONE SEEN

## 2014-08-15 LAB — VITAMIN D 25 HYDROXY (VIT D DEFICIENCY, FRACTURES): Vit D, 25-Hydroxy: 42 ng/mL (ref 30–100)

## 2014-08-15 LAB — MICROALBUMIN / CREATININE URINE RATIO
CREATININE, URINE: 33.6 mg/dL
MICROALB/CREAT RATIO: 6 mg/g (ref 0.0–30.0)
Microalb, Ur: 0.2 mg/dL (ref <2.0)

## 2014-08-15 LAB — INSULIN, RANDOM: Insulin: 5.3 u[IU]/mL (ref 2.0–19.6)

## 2014-09-25 ENCOUNTER — Other Ambulatory Visit (INDEPENDENT_AMBULATORY_CARE_PROVIDER_SITE_OTHER): Payer: 59

## 2014-09-25 DIAGNOSIS — Z1212 Encounter for screening for malignant neoplasm of rectum: Secondary | ICD-10-CM

## 2014-09-25 LAB — POC HEMOCCULT BLD/STL (HOME/3-CARD/SCREEN)
Card #2 Fecal Occult Blod, POC: NEGATIVE
Card #3 Fecal Occult Blood, POC: NEGATIVE
Fecal Occult Blood, POC: NEGATIVE

## 2014-10-05 ENCOUNTER — Other Ambulatory Visit: Payer: Self-pay | Admitting: Physician Assistant

## 2014-10-05 MED ORDER — LORAZEPAM 1 MG PO TABS: ORAL_TABLET | ORAL | Status: DC

## 2015-01-10 ENCOUNTER — Ambulatory Visit (INDEPENDENT_AMBULATORY_CARE_PROVIDER_SITE_OTHER): Payer: 59 | Admitting: Physician Assistant

## 2015-01-10 ENCOUNTER — Encounter: Payer: Self-pay | Admitting: Physician Assistant

## 2015-01-10 VITALS — BP 100/60 | HR 89 | Temp 98.1°F | Resp 16 | Ht 63.0 in | Wt 125.0 lb

## 2015-01-10 DIAGNOSIS — Z23 Encounter for immunization: Secondary | ICD-10-CM

## 2015-01-10 DIAGNOSIS — R197 Diarrhea, unspecified: Secondary | ICD-10-CM | POA: Diagnosis not present

## 2015-01-10 DIAGNOSIS — K589 Irritable bowel syndrome without diarrhea: Secondary | ICD-10-CM

## 2015-01-10 DIAGNOSIS — R634 Abnormal weight loss: Secondary | ICD-10-CM | POA: Diagnosis not present

## 2015-01-10 LAB — CBC WITH DIFFERENTIAL/PLATELET
BASOS ABS: 0 10*3/uL (ref 0.0–0.1)
Basophils Relative: 0 % (ref 0–1)
EOS ABS: 0.1 10*3/uL (ref 0.0–0.7)
EOS PCT: 2 % (ref 0–5)
HEMATOCRIT: 40.3 % (ref 36.0–46.0)
Hemoglobin: 13.4 g/dL (ref 12.0–15.0)
LYMPHS PCT: 23 % (ref 12–46)
Lymphs Abs: 1.3 10*3/uL (ref 0.7–4.0)
MCH: 31.1 pg (ref 26.0–34.0)
MCHC: 33.3 g/dL (ref 30.0–36.0)
MCV: 93.5 fL (ref 78.0–100.0)
MONOS PCT: 8 % (ref 3–12)
MPV: 9.7 fL (ref 8.6–12.4)
Monocytes Absolute: 0.5 10*3/uL (ref 0.1–1.0)
NEUTROS PCT: 67 % (ref 43–77)
Neutro Abs: 3.9 10*3/uL (ref 1.7–7.7)
PLATELETS: 263 10*3/uL (ref 150–400)
RBC: 4.31 MIL/uL (ref 3.87–5.11)
RDW: 12.6 % (ref 11.5–15.5)
WBC: 5.8 10*3/uL (ref 4.0–10.5)

## 2015-01-10 LAB — HEPATIC FUNCTION PANEL
ALBUMIN: 4.1 g/dL (ref 3.6–5.1)
ALK PHOS: 60 U/L (ref 33–115)
ALT: 20 U/L (ref 6–29)
AST: 21 U/L (ref 10–35)
Bilirubin, Direct: 0.2 mg/dL (ref <=0.2)
Indirect Bilirubin: 1.1 mg/dL (ref 0.2–1.2)
TOTAL PROTEIN: 7 g/dL (ref 6.1–8.1)
Total Bilirubin: 1.3 mg/dL — ABNORMAL HIGH (ref 0.2–1.2)

## 2015-01-10 LAB — BASIC METABOLIC PANEL WITH GFR
BUN: 15 mg/dL (ref 7–25)
CHLORIDE: 101 mmol/L (ref 98–110)
CO2: 27 mmol/L (ref 20–31)
CREATININE: 0.71 mg/dL (ref 0.50–1.10)
Calcium: 9.3 mg/dL (ref 8.6–10.2)
GFR, Est African American: >89 mL/min (ref >=60)
GFR, Est Non African American: >89 mL/min (ref >=60)
GLUCOSE: 85 mg/dL (ref 65–99)
POTASSIUM: 4.2 mmol/L (ref 3.5–5.3)
Sodium: 136 mmol/L (ref 135–146)

## 2015-01-10 LAB — TSH: TSH: 1.51 u[IU]/mL (ref 0.350–4.500)

## 2015-01-10 MED ORDER — ELUXADOLINE 75 MG PO TABS: 75.0000 mg | ORAL_TABLET | Freq: Two times a day (BID) | ORAL | Status: DC

## 2015-01-10 NOTE — Progress Notes (Signed)
Subjective:    Patient ID: Tina Holmes, female    DOB: Feb 13, 1971, 44 y.o.   MRN: [id number]  HPI 44 y.o. WF with history of diarrhea. She states that since her gallbladder was taken out she has had diarrhea however it has been worse the last 3-4 months, she will have stool incontinence, watery diarrhea, she states the dicyclomine has not helped, she has been taking imodium that has helped some. She has had an endoscopy, never colonoscopy. No blood or mucus, will be yellow, floating in the toilet. No Ab pain, cramping, no warning to it. She has been very stressed for past 6 months, new job that is not going well, she states appetite is down, has lost some weight, 20 lbs in 1 year, but no nausea, night sweats. Nothing associated with it.  Negative fecal occult blood 08/2014. Can have diarrhea up to 3 x a day.    Blood pressure 100/60, pulse 89, temperature 98.1 F (36.7 C), resp. rate 16, height 5\' 3"  (1.6 m), weight 125 lb (56.7 kg), last menstrual period 01/09/2015, SpO2 98 %.  Wt Readings from Last 10 Encounters:  01/10/15 125 lb (56.7 kg)  08/14/14 133 lb 9.6 oz (60.601 kg)  05/09/14 137 lb (62.143 kg)  08/10/13 138 lb (62.596 kg)  04/24/13 141 lb (63.957 kg)  02/16/13 143 lb (64.864 kg)  03/09/12 135 lb (61.236 kg)  01/14/11 137 lb (62.143 kg)    Review of Systems  Constitutional: Positive for fatigue and unexpected weight change. Negative for fever, chills, diaphoresis, activity change and appetite change.  HENT: Negative.   Respiratory: Negative.  Negative for cough.   Cardiovascular: Negative.   Gastrointestinal: Positive for diarrhea. Negative for nausea, vomiting, abdominal pain, constipation, blood in stool, abdominal distention, anal bleeding and rectal pain.  Genitourinary: Negative.   Musculoskeletal: Negative for myalgias, back pain, joint swelling, arthralgias, gait problem, neck pain and neck stiffness.  Skin: Negative.  Negative for rash.  Neurological:  Negative.  Negative for dizziness and headaches.       Objective:   Physical Exam  Constitutional: She is oriented to person, place, and time. She appears well-developed and well-nourished.  HENT:  Head: Normocephalic and atraumatic.  Right Ear: External ear normal.  Left Ear: External ear normal.  Mouth/Throat: Oropharynx is clear and moist.  Eyes: Conjunctivae and EOM are normal. Pupils are equal, round, and reactive to light.  Neck: Normal range of motion. Neck supple. No thyromegaly present.  Cardiovascular: Normal rate, regular rhythm and normal heart sounds.  Exam reveals no gallop and no friction rub.   No murmur heard. Pulmonary/Chest: Effort normal and breath sounds normal. No respiratory distress. She has no wheezes.  Abdominal: Soft. Bowel sounds are normal. She exhibits no shifting dullness, no distension, no abdominal bruit, no pulsatile midline mass and no mass. There is no hepatosplenomegaly. There is tenderness (mild) in the right lower quadrant. There is no rigidity, no rebound, no guarding, no CVA tenderness, no tenderness at McBurney's point and negative Murphy's sign. No hernia.  Musculoskeletal: Normal range of motion.  Lymphadenopathy:    She has no cervical adenopathy.  Neurological: She is alert and oriented to person, place, and time.  Skin: Skin is warm and dry.  Psychiatric: She has a normal mood and affect.      Assessment & Plan:  Diarrhea with benign abdomen exam, Diarrhea of uncertain etiology, severe in severity:  Lab studies per orders. Medications per orders. Stool studies per orders.  May need possible GI consult.  Meds ordered this encounter  Medications  . Eluxadoline (VIBERZI) 75 MG TABS    Sig: Take 75 mg by mouth 2 (two) times daily.    Dispense:  60 tablet    Refill:  3    Order Specific Question:  Supervising Provider    Answer:  Unk Pinto [phone]   Check labs. The 'BRAT' diet is suggested, then progress to diet as tolerated.  Call if bloody stools, persistent diarrhea, not voiding regularly, unable to take oral fluids, vomiting, high fever, severe weakness, abdominal pain or failure to improve in 2-3 days.

## 2015-01-10 NOTE — Patient Instructions (Signed)

## 2015-01-11 LAB — GLIA (IGA/G) + TTG IGA
GLIADIN IGA: 18 U (ref <20)
Gliadin IgG: 11 Units (ref <20)
Tissue Transglutaminase Ab, IgA: 1 U/mL (ref <4)

## 2015-01-11 LAB — SEDIMENTATION RATE: Sed Rate: 12 mm/hr (ref 0–20)

## 2015-01-18 ENCOUNTER — Other Ambulatory Visit: Payer: Self-pay | Admitting: Physician Assistant

## 2015-01-18 ENCOUNTER — Other Ambulatory Visit: Payer: Self-pay | Admitting: *Deleted

## 2015-01-18 DIAGNOSIS — R634 Abnormal weight loss: Secondary | ICD-10-CM

## 2015-01-18 DIAGNOSIS — R197 Diarrhea, unspecified: Secondary | ICD-10-CM

## 2015-01-19 LAB — FECAL LACTOFERRIN, QUANT: Lactoferrin: NEGATIVE

## 2015-01-21 ENCOUNTER — Other Ambulatory Visit: Payer: Self-pay | Admitting: Physician Assistant

## 2015-01-21 ENCOUNTER — Encounter: Payer: Self-pay | Admitting: Physician Assistant

## 2015-01-21 DIAGNOSIS — R197 Diarrhea, unspecified: Secondary | ICD-10-CM

## 2015-01-21 DIAGNOSIS — R634 Abnormal weight loss: Secondary | ICD-10-CM

## 2015-01-21 MED ORDER — LORAZEPAM 1 MG PO TABS: ORAL_TABLET | ORAL | Status: DC

## 2015-01-21 NOTE — Progress Notes (Signed)
Rx was sent into [facility] pharmacy on [address].

## 2015-01-23 LAB — GASTROINTESTINAL PATHOGEN PANEL PCR
C. difficile Tox A/B, PCR: NEGATIVE
CAMPYLOBACTER, PCR: NEGATIVE
CRYPTOSPORIDIUM, PCR: NEGATIVE
E COLI (STEC) STX1/STX2, PCR: NEGATIVE
E coli (ETEC) LT/ST PCR: NEGATIVE
E coli 0157, PCR: NEGATIVE
Giardia lamblia, PCR: NEGATIVE
NOROVIRUS, PCR: NEGATIVE
ROTAVIRUS, PCR: NEGATIVE
SALMONELLA, PCR: NEGATIVE
SHIGELLA, PCR: NEGATIVE

## 2015-01-29 ENCOUNTER — Encounter: Payer: Self-pay | Admitting: Internal Medicine

## 2015-02-14 ENCOUNTER — Other Ambulatory Visit: Payer: Self-pay

## 2015-02-14 DIAGNOSIS — Z1231 Encounter for screening mammogram for malignant neoplasm of breast: Secondary | ICD-10-CM

## 2015-02-19 ENCOUNTER — Encounter: Payer: Self-pay | Admitting: Physician Assistant

## 2015-02-24 ENCOUNTER — Encounter: Payer: Self-pay | Admitting: Physician Assistant

## 2015-03-12 ENCOUNTER — Ambulatory Visit
Admission: RE | Admit: 2015-03-12 | Discharge: 2015-03-12 | Disposition: A | Discharge status: Home / Self Care | Payer: 59 | Source: Ambulatory Visit

## 2015-03-12 DIAGNOSIS — Z1231 Encounter for screening mammogram for malignant neoplasm of breast: Secondary | ICD-10-CM

## 2015-03-18 ENCOUNTER — Encounter: Payer: Self-pay | Admitting: Physician Assistant

## 2015-03-19 ENCOUNTER — Encounter: Payer: Self-pay | Admitting: Physician Assistant

## 2015-03-19 ENCOUNTER — Ambulatory Visit (INDEPENDENT_AMBULATORY_CARE_PROVIDER_SITE_OTHER): Payer: 59 | Admitting: Physician Assistant

## 2015-03-19 VITALS — BP 96/64 | HR 68 | Ht 63.0 in | Wt 127.6 lb

## 2015-03-19 DIAGNOSIS — K219 Gastro-esophageal reflux disease without esophagitis: Secondary | ICD-10-CM

## 2015-03-19 DIAGNOSIS — F458 Other somatoform disorders: Secondary | ICD-10-CM

## 2015-03-19 DIAGNOSIS — R1310 Dysphagia, unspecified: Secondary | ICD-10-CM

## 2015-03-19 DIAGNOSIS — R0989 Other specified symptoms and signs involving the circulatory and respiratory systems: Secondary | ICD-10-CM

## 2015-03-19 DIAGNOSIS — R197 Diarrhea, unspecified: Secondary | ICD-10-CM | POA: Diagnosis not present

## 2015-03-19 MED ORDER — PANTOPRAZOLE SODIUM 40 MG PO TBEC: 40.0000 mg | DELAYED_RELEASE_TABLET | Freq: Every day | ORAL | Status: DC

## 2015-03-19 MED ORDER — CHOLESTYRAMINE 4 G PO PACK: PACK | ORAL | Status: DC

## 2015-03-19 NOTE — Patient Instructions (Signed)
You have been scheduled for a Barium Esophogram at  [facility]) on 03-22-2015 at 10:15. Please arrive 15 minutes prior to your appointment for registration. Make certain not to have anything to eat or drink 6 hours prior to your test. If you need to reschedule for any reason, please contact radiology at [phone] to do so. __________________________________________________________________ A barium swallow is an examination that concentrates on views of the esophagus. This tends to be a double contrast exam (barium and two liquids which, when combined, create a gas to distend the wall of the oesophagus) or single contrast (non-ionic iodine based). The study is usually tailored to your symptoms so a good history is essential. Attention is paid during the study to the form, structure and configuration of the esophagus, looking for functional disorders (such as aspiration, dysphagia, achalasia, motility and reflux) EXAMINATION You may be asked to change into a gown, depending on the type of swallow being performed. A radiologist and radiographer will perform the procedure. The radiologist will advise you of the type of contrast selected for your procedure and direct you during the exam. You will be asked to stand, sit or lie in several different positions and to hold a small amount of fluid in your mouth before being asked to swallow while the imaging is performed .In some instances you may be asked to swallow barium coated marshmallows to assess the motility of a solid food bolus. The exam can be recorded as a digital or video fluoroscopy procedure. POST PROCEDURE It will take 1-2 days for the barium to pass through your system. To facilitate this, it is important, unless otherwise directed, to increase your fluids for the next 24-48hrs and to resume your normal diet.  This test typically takes about 30 minutes to  perform. __________________________________________________________________________________   Your physician has requested that you go to the basement for the following lab work before leaving today: pancreatic fecal elastase.  We have sent the following medications to your pharmacy for you to pick up at your convenience: Pantoprazole and Questran   We have given you information on high fiber low fat diets to read and review. Also Antireflux info has been given to you.  Please stop taking the Viberzi.  Please follow up with Cecille Rubin Hvozdovic on 04-24-2015 @9 :45am

## 2015-03-19 NOTE — Progress Notes (Signed)
Patient ID: Tina Holmes, female   DOB: [birth year], 44 y.o.   MRN: [id number]    HPI:  Tina Holmes is a 44 y.o.   female  referred by Vicie Mutters, PA-C for evaluation of diarrhea and weight loss. Past medical history significant for anxiety, migraines, GERD, depression, restless leg syndrome, and IBS. She is known to Dr. Ardis Hughs from prior evaluation for GERD in September 2007. She had EGD on 12/17/2005 which was a normal examination. She was felt to have nonerosive GERD.  She is here today with complaints of diarrhea. She states that she had a cholecystectomy in the late 1990s and has had diarrhea since. Through the years she has tried Imodium with little relief. She has been putting up with this for years but she states over the past 6-8 months her diarrhea got worse. She was having 4 or 5 loose bowel movements per day occasionally with an accident. She was seen by her primary care provider in October and was started on viberzi. Since on viberzi, she has noted episodes where her chest feels heavy in her throat feels tight. She spoke with her PCP about this and was told to try Tums and Zantac. Yesterday she felt as if she couldn't catch her breath and she feels this is due to viberzi. Since on viberzi, she is having one or 2 mushy bowel movements daily with no blood. Her appetite has been good and she has gained her weight back since she started this medication. She is unaware of family history of colon cancer, colon polyps or inflammatory bowel disease. She reports that prior to starting viberzi, her stools were yellow and often floated. On the new medication her stools no longer float but she continues to have occasional urgency. She reports that she has a bowel movement first thing in the morning and then when she gets to work she has to have another loose bowel movement.  She also reports that she has been having intermittent dysphagia to solids that has been becoming more frequent. She has  a frequent sensation of a lump in her throat. She has no dysphagia to liquids. She does get some breakthrough heartburn. She denies epigastric pain, nausea, or vomiting.   Past Medical History  Diagnosis Date  . Restless leg syndrome   . Depression   . GERD (gastroesophageal reflux disease)   . IBS (irritable bowel syndrome)   . Migraines   . Anxiety     Past Surgical History  Procedure Laterality Date  . Termination of pregnanc      18 WEEKS--DOWNS SYNDROME  . Cholecystectomy     Family History  Problem Relation Age of Onset  . Diabetes Mother   . Hypertension Mother   . Heart attack Mother   . Hypertension Father   . CVA Maternal Grandfather    Social History  Substance Use Topics  . Smoking status: Former Smoker    Quit date: 03/30/1996  . Smokeless tobacco: Never Used  . Alcohol Use: 0.0 oz/week    0 Standard drinks or equivalent per week     Comment: OCC   Current Outpatient Prescriptions  Medication Sig Dispense Refill  . acyclovir (ZOVIRAX) 400 MG tablet Take 1 capsule     2 x day for cold sores and fever blisters 60 tablet 99  . Cholecalciferol (VITAMIN D PO) Take 6,000 Units by mouth.      Marland Kitchen LORazepam (ATIVAN) 1 MG tablet TAKE 1 TABLET AT BEDTIME .Marland KitchenMarland Kitchen OCCASSIONALLY TWICE  A DAY 60 tablet 1  . ORTHO TRI-CYCLEN, 28, 0.18/0.215/0.25 MG-35 MCG tablet TAKE 1 TABLET BY MOUTH DAILY. 28 tablet 11  . cholestyramine (QUESTRAN) 4 G packet ONCE A DAY IN JUICE 30 each 3  . pantoprazole (PROTONIX) 40 MG tablet Take 1 tablet (40 mg total) by mouth daily. 30 tablet 3   No current facility-administered medications for this visit.   Allergies  Allergen Reactions  . Imitrex [Sumatriptan]     Throat swelling  . Celexa [Citalopram]     Decreased libido  . Codeine     N/V  . Zoloft [Sertraline Hcl]     N/V     Review of Systems: Gen: Denies any fever, chills, sweats, anorexia, fatigue, weakness, malaise, weight loss, and sleep disorder CV: Denies chest pain, angina,  palpitations, syncope, orthopnea, PND, peripheral edema, and claudication. Resp: Denies dyspnea at rest, dyspnea with exercise, cough, sputum, wheezing, coughing up blood, and pleurisy. GI: Denies vomiting blood, jaundice, and fecal incontinence.  Admits to diarrhea, dysphagia to solids, and globus sensation GU : Denies urinary burning, blood in urine, urinary frequency, urinary hesitancy, nocturnal urination, and urinary incontinence. MS: Denies joint pain, limitation of movement, and swelling, stiffness, low back pain, extremity pain. Denies muscle weakness, cramps, atrophy.  Derm: Denies rash, itching, dry skin, hives, moles, warts, or unhealing ulcers.  Psych: Denies depression, anxiety, memory loss, suicidal ideation, hallucinations, paranoia, and confusion. Heme: Denies bruising, bleeding, and enlarged lymph nodes. Neuro:  Denies any headaches, dizziness, paresthesias. Endo:  Denies any problems with DM, thyroid, adrenal function   LAB RESULTS: GI pathogen panel on 01/18/2015 was negative. Fecal lactoferrin 01/18/2015 was negative. CBC 01/10/2015 WBC 5.8, hemoglobin 13.4, hematocrit 40.3, platelets 263,000  Physical Exam: BP 96/64 mmHg  Pulse 68  Ht 5\' 3"  (1.6 m)  Wt 127 lb 9.6 oz (57.879 kg)  BMI 22.61 kg/m2  LMP 03/05/2015 (Exact Date) Constitutional: Pleasant,well-developed,female in no acute distress. HEENT: Normocephalic and atraumatic. Conjunctivae are normal. No scleral icterus. Neck supple. No JVD Cardiovascular: Normal rate, regular rhythm.  Pulmonary/chest: Effort normal and breath sounds normal. No wheezing, rales or rhonchi. Abdominal: Soft, nondistended, nontender. Bowel sounds active throughout. There are no masses palpable. No hepatomegaly. Extremities: no edema Lymphadenopathy: No cervical adenopathy noted. Neurological: Alert and oriented to person place and time. Skin: Skin is warm and dry. No rashes noted. Psychiatric: Normal mood and affect. Behavior is  normal.  ASSESSMENT AND PLAN: #1 diarrhea. Patient reports that she has had diarrhea since her cholecystectomy in the past. She has tried Imodium sporadically with no relief. She has never tried Questran, Colestid, or antispasmodics. Although Viberzi has somewhat slowed her stools, she feels it is causing her to feel as if her throat is closing. At this time we will discontinue fibers a and she will be given a trial of Questran 4 g one packet in juice daily. If this provides no relief, she will contact us and we will increase it to twice a day. She has been instructed to try to adhere to a high-fiber low-fat diet. A pancreatic fecal elastase will be obtained today as well.  #2. GERD, globus sensation, and dysphagia. All likely related to poorly controlled reflux. An antireflux regimen has been reviewed. She will be given a trial of pantoprazole 40 mg by mouth every morning 30 minutes prior to breakfast. A barium swallow with tablet will be obtained. She will follow up in one month to review results, sooner if needed. If she is noted to  have a stricture or if she has no relief of her upper GI symptoms with the addition of pantoprazole, she will be considered for EGD.    Dorr Perrot, Vita Barley PA-C 03/19/2015, 8:12 PM  CC: Vicie Mutters, PA-C

## 2015-03-20 ENCOUNTER — Encounter: Payer: Self-pay | Admitting: Physician Assistant

## 2015-03-21 NOTE — Progress Notes (Signed)
i agree with the above note, plan 

## 2015-03-22 ENCOUNTER — Other Ambulatory Visit: Payer: 59

## 2015-03-26 ENCOUNTER — Other Ambulatory Visit: Payer: 59

## 2015-04-03 ENCOUNTER — Ambulatory Visit: Payer: Self-pay | Admitting: Internal Medicine

## 2015-04-03 ENCOUNTER — Ambulatory Visit
Admission: RE | Admit: 2015-04-03 | Discharge: 2015-04-03 | Disposition: A | Discharge status: Home / Self Care | Payer: 59 | Source: Ambulatory Visit | Attending: Physician Assistant | Admitting: Physician Assistant

## 2015-04-03 DIAGNOSIS — R197 Diarrhea, unspecified: Secondary | ICD-10-CM

## 2015-04-03 DIAGNOSIS — R1310 Dysphagia, unspecified: Secondary | ICD-10-CM

## 2015-04-03 DIAGNOSIS — R0989 Other specified symptoms and signs involving the circulatory and respiratory systems: Secondary | ICD-10-CM

## 2015-04-03 DIAGNOSIS — K219 Gastro-esophageal reflux disease without esophagitis: Secondary | ICD-10-CM

## 2015-04-04 ENCOUNTER — Encounter: Payer: Self-pay | Admitting: Physician Assistant

## 2015-04-05 MED ORDER — ALPRAZOLAM 1 MG PO TABS: 1.0000 mg | ORAL_TABLET | Freq: Two times a day (BID) | ORAL | Status: DC | PRN

## 2015-04-22 ENCOUNTER — Telehealth: Payer: Self-pay | Admitting: Emergency Medicine

## 2015-04-22 NOTE — Telephone Encounter (Signed)
I spoke with patient regarding her pancreatic fecal elastase lab that was ordered at her last ov because there were no results in the system. She states she was told by the lab that this specimen could not be solid stool. Since she began taking the new Rx she has not had any problems with diarrhea and therefore has not had the opportunity to give a specimen.

## 2015-04-24 ENCOUNTER — Ambulatory Visit: Payer: 59 | Admitting: Physician Assistant

## 2015-04-25 ENCOUNTER — Other Ambulatory Visit: Payer: Self-pay | Admitting: Gynecology

## 2015-05-22 ENCOUNTER — Other Ambulatory Visit (HOSPITAL_COMMUNITY)
Admission: RE | Admit: 2015-05-22 | Discharge: 2015-05-22 | Disposition: A | Discharge status: Home / Self Care | Payer: 59 | Source: Ambulatory Visit | Attending: Gynecology | Admitting: Gynecology

## 2015-05-22 ENCOUNTER — Ambulatory Visit (INDEPENDENT_AMBULATORY_CARE_PROVIDER_SITE_OTHER): Payer: 59 | Admitting: Gynecology

## 2015-05-22 ENCOUNTER — Encounter: Payer: Self-pay | Admitting: Gynecology

## 2015-05-22 VITALS — BP 120/74 | Ht 63.0 in | Wt 130.0 lb

## 2015-05-22 DIAGNOSIS — Z01411 Encounter for gynecological examination (general) (routine) with abnormal findings: Secondary | ICD-10-CM | POA: Insufficient documentation

## 2015-05-22 DIAGNOSIS — Z01419 Encounter for gynecological examination (general) (routine) without abnormal findings: Secondary | ICD-10-CM

## 2015-05-22 MED ORDER — NORGESTIM-ETH ESTRAD TRIPHASIC 0.18/0.215/0.25 MG-35 MCG PO TABS: 1.0000 | ORAL_TABLET | Freq: Every day | ORAL | Status: DC

## 2015-05-22 NOTE — Addendum Note (Signed)
Addended by: Nelva Nay on: 05/22/2015 04:45 PM   Modules accepted: Orders

## 2015-05-22 NOTE — Patient Instructions (Signed)

## 2015-05-22 NOTE — Progress Notes (Signed)
Tina Holmes 01/26/1971 [id number]        45 y.o.  G2P0011  for annual exam.  Doing well without complaints  Past medical history,surgical history, problem list, medications, allergies, family history and social history were all reviewed and documented as reviewed in the EPIC chart.  ROS:  Performed with pertinent positives and negatives included in the history, assessment and plan.   Additional significant findings :  none   Exam: Caryn Bee assistant Filed Vitals:   05/22/15 1612  BP: 120/74  Height: 5\' 3"  (1.6 m)  Weight: 130 lb (58.968 kg)   General appearance:  Normal affect, orientation and appearance. Skin: Grossly normal HEENT: Without gross lesions.  No cervical or supraclavicular adenopathy. Thyroid normal.  Lungs:  Clear without wheezing, rales or rhonchi Cardiac: RR, without RMG Abdominal:  Soft, nontender, without masses, guarding, rebound, organomegaly or hernia Breasts:  Examined lying and sitting without masses, retractions, discharge or axillary adenopathy. Pelvic:  Ext/BUS/vagina normal  Cervix normal  Uterus axial, normal size, shape and contour, midline and mobile nontender   Adnexa without masses or tenderness    Anus and perineum normal   Rectovaginal normal sphincter tone without palpated masses or tenderness.    Assessment/Plan:  45 y.o. G3P0011 female for annual exam with regular menses, oral contraceptives.   1. Ortho Tri-Cyclen equivalent doing well. Wants to continue. Again reviewed risks include increased risk of stroke heart attack DVT. She did smoke a number of years ago but has stopped approaching 20 years now. Patient understands unknown whether increases her risk of thrombosis with this history. Accepts the risk. Refill 1 year. 2. Pap smear/HPV 02/2012 negative. Patient uncomfortable waiting on Pap smear and a Pap smear was done today. No history of significant abnormal Pap smears previously.  3. Mammography 02/2015. Continue with annual  mammography when due. SBE monthly reviewed. 4. Health maintenance. No routine blood work done as patient reports this done at her primary physician's office. Follow up in one year, sooner as needed.   Anastasio Auerbach MD, 4:32 PM 05/22/2015

## 2015-05-23 LAB — URINALYSIS W MICROSCOPIC + REFLEX CULTURE
Bilirubin Urine: NEGATIVE
CASTS: NONE SEEN [LPF]
Crystals: NONE SEEN [HPF]
Glucose, UA: NEGATIVE
Hgb urine dipstick: NEGATIVE
KETONES UR: NEGATIVE
NITRITE: NEGATIVE
Protein, ur: NEGATIVE
SPECIFIC GRAVITY, URINE: 1.016 (ref 1.001–1.035)
YEAST: NONE SEEN [HPF]
pH: 8 (ref 5.0–8.0)

## 2015-05-24 LAB — CYTOLOGY - PAP

## 2015-05-25 LAB — URINE CULTURE: Colony Count: 75000

## 2015-05-27 ENCOUNTER — Other Ambulatory Visit: Payer: Self-pay | Admitting: Gynecology

## 2015-05-27 MED ORDER — NITROFURANTOIN MONOHYD MACRO 100 MG PO CAPS: 100.0000 mg | ORAL_CAPSULE | Freq: Two times a day (BID) | ORAL | Status: DC

## 2015-05-29 ENCOUNTER — Encounter: Payer: Self-pay | Admitting: Physician Assistant

## 2015-05-29 ENCOUNTER — Ambulatory Visit (INDEPENDENT_AMBULATORY_CARE_PROVIDER_SITE_OTHER): Payer: 59 | Admitting: Physician Assistant

## 2015-05-29 VITALS — BP 104/70 | HR 76 | Ht 63.0 in | Wt 128.1 lb

## 2015-05-29 DIAGNOSIS — R197 Diarrhea, unspecified: Secondary | ICD-10-CM

## 2015-05-29 DIAGNOSIS — K219 Gastro-esophageal reflux disease without esophagitis: Secondary | ICD-10-CM | POA: Diagnosis not present

## 2015-05-29 MED ORDER — RANITIDINE HCL 150 MG PO TABS: 150.0000 mg | ORAL_TABLET | Freq: Every day | ORAL | Status: DC

## 2015-05-29 MED ORDER — CHOLESTYRAMINE 4 GM/DOSE PO POWD: 8.0000 g | Freq: Every day | ORAL | Status: DC

## 2015-05-29 NOTE — Patient Instructions (Signed)
We have sent the following medications to your pharmacy for you to pick up at your convenience:  Ranitidine 150 mg at bedtime. Increase Questran to 1.5 to 2 packets daily as needed.

## 2015-05-29 NOTE — Progress Notes (Signed)
Patient ID: Tina Holmes, female   DOB: [birth year], 45 y.o.   MRN: [id number]     History of Present Illness: Tina Holmes is a pleasant 45 year old female who was seen here on December 20 for evaluation of diarrhea and weight loss. Past medical history significant for anxiety, migraines, GERD, depression, restless leg syndrome, and IBS. She was known to Dr. Ardis Hughs from prior evaluation for GERD in September 2007. She had an EGD in September 2007 which was normal and she was felt to have nonerosive GERD. At her last visit she was complaining of diarrhea since her cholecystectomy in the late 1990s. She was given a trial of Questran and returns today stating that her diarrhea is much improved. Over the past several days she has been putting her Questran and apple juice instead of orange juice and notes that her stools are softer. GERD is well controlled with pantoprazole but she occasionally gets some breakthrough in the evening.   Past Medical History  Diagnosis Date  . Restless leg syndrome   . Depression   . GERD (gastroesophageal reflux disease)   . IBS (irritable bowel syndrome)   . Migraines   . Anxiety     Past Surgical History  Procedure Laterality Date  . Termination of pregnanc      18 WEEKS--DOWNS SYNDROME  . Cholecystectomy     Family History  Problem Relation Age of Onset  . Diabetes Mother   . Hypertension Mother   . Heart attack Mother   . Hypertension Father   . CVA Maternal Grandfather    Social History  Substance Use Topics  . Smoking status: Former Smoker    Quit date: 03/30/1996  . Smokeless tobacco: Never Used  . Alcohol Use: 0.0 oz/week    0 Standard drinks or equivalent per week     Comment: OCC   Current Outpatient Prescriptions  Medication Sig Dispense Refill  . ALPRAZolam (XANAX) 1 MG tablet Take 1 tablet (1 mg total) by mouth 2 (two) times daily as needed for anxiety. 60 tablet 1  . Cholecalciferol (VITAMIN D PO) Take 6,000 Units by mouth.       . nitrofurantoin, macrocrystal-monohydrate, (MACROBID) 100 MG capsule Take 1 capsule (100 mg total) by mouth 2 (two) times daily with a meal. 10 capsule 0  . Norgestimate-Ethinyl Estradiol Triphasic (ORTHO TRI-CYCLEN, 28,) 0.18/0.215/0.25 MG-35 MCG tablet Take 1 tablet by mouth daily. 28 tablet 12  . pantoprazole (PROTONIX) 40 MG tablet Take 1 tablet (40 mg total) by mouth daily. 30 tablet 3  . cholestyramine (QUESTRAN) 4 GM/DOSE powder Take 2 packets (8 g total) by mouth daily. 378 g 3  . ranitidine (ZANTAC) 150 MG tablet Take 1 tablet (150 mg total) by mouth at bedtime. 30 tablet 6   No current facility-administered medications for this visit.   Allergies  Allergen Reactions  . Imitrex [Sumatriptan]     Throat swelling  . Celexa [Citalopram]     Decreased libido  . Codeine     N/V  . Zoloft [Sertraline Hcl]     N/V     Review of Systems: Gen: Denies any fever, chills, sweats, anorexia, fatigue, weakness, malaise, weight loss, and sleep disorder CV: Denies chest pain, angina, palpitations, syncope, orthopnea, PND, peripheral edema, and claudication. Resp: Denies dyspnea at rest, dyspnea with exercise, cough, sputum, wheezing, coughing up blood, and pleurisy. GI: Denies vomiting blood, jaundice, and fecal incontinence.   Denies dysphagia or odynophagia. GU : Denies urinary burning, blood in  urine, urinary frequency, urinary hesitancy, nocturnal urination, and urinary incontinence. MS: Denies joint pain, limitation of movement, and swelling, stiffness, low back pain, extremity pain. Denies muscle weakness, cramps, atrophy.  Derm: Denies rash, itching, dry skin, hives, moles, warts, or unhealing ulcers.  Psych: Denies depression, anxiety, memory loss, suicidal ideation, hallucinations, paranoia, and confusion. Heme: Denies bruising, bleeding, and enlarged lymph nodes. Neuro:  Denies any headaches, dizziness, paresthesia Endo:  Denies any problems with DM, thyroid,  adrenal    Physical Exam: BP 104/70 mmHg  Pulse 76  Ht 5\' 3"  (1.6 m)  Wt 128 lb 2 oz (58.117 kg)  BMI 22.70 kg/m2  LMP 05/02/2015 General: Pleasant, well developed , female in no acute distress Head: Normocephalic and atraumatic Eyes:  sclerae anicteric, conjunctiva pink  Ears: Normal auditory acuity Lungs: Clear throughout to auscultation Heart: Regular rate and rhythm Abdomen: Soft, non distended, non-tender. No masses, no hepatomegaly. Normal bowel sounds Musculoskeletal: Symmetrical with no gross deformities  Extremities: No edema  Neurological: Alert oriented x 4, grossly nonfocal Psychological:  Alert and cooperative. Normal mood and affect  Assessment and Recommendations: #1. Diarrhea. This was likely colorrhea can nature. She has had improvement with Questran though her stools have been softer the last few days. She will increase her Questran to 1-1/2-2 packets daily as needed to see if this works. If it works but she is unable to tolerate the taste, she will call and be switched to Colestid. (She did not want to switch today because she just picked up more Questran yesterday.)  #2. GERD. She's been reminded to adhere to an antireflux regimen. She will continue pantoprazole 40 mg by mouth every morning and will add ranitidine 150 mg at at bedtime. She will follow up in 3 months, sooner if needed.        Brinton Brandel, Vita Barley PA-C 05/29/2015,

## 2015-05-30 NOTE — Progress Notes (Signed)
i agree with the above note, plan 

## 2015-07-15 ENCOUNTER — Other Ambulatory Visit: Payer: Self-pay | Admitting: *Deleted

## 2015-07-15 MED ORDER — PANTOPRAZOLE SODIUM 40 MG PO TBEC
40.0000 mg | DELAYED_RELEASE_TABLET | Freq: Every day | ORAL | Status: DC
Start: 2015-07-15 — End: 2015-08-30

## 2015-07-24 ENCOUNTER — Encounter: Payer: Self-pay | Admitting: Gastroenterology

## 2015-07-24 MED ORDER — COLESTIPOL HCL 1 G PO TABS
1.0000 g | ORAL_TABLET | Freq: Two times a day (BID) | ORAL | Status: DC
Start: 2015-07-24 — End: 2015-11-24

## 2015-07-24 NOTE — Telephone Encounter (Signed)
Dr Ardis Hughs is this ok to change?  She has an upcoming appt with you.

## 2015-08-06 ENCOUNTER — Encounter: Payer: Self-pay | Admitting: Gastroenterology

## 2015-08-06 ENCOUNTER — Ambulatory Visit (INDEPENDENT_AMBULATORY_CARE_PROVIDER_SITE_OTHER): Payer: 59 | Admitting: Gastroenterology

## 2015-08-06 VITALS — BP 100/68 | HR 72 | Ht 63.0 in | Wt 128.0 lb

## 2015-08-06 DIAGNOSIS — K219 Gastro-esophageal reflux disease without esophagitis: Secondary | ICD-10-CM | POA: Diagnosis not present

## 2015-08-06 DIAGNOSIS — R197 Diarrhea, unspecified: Secondary | ICD-10-CM | POA: Diagnosis not present

## 2015-08-06 NOTE — Progress Notes (Signed)
Review of pertinent gastrointestinal problems: 1. GERD in September 2007. She had EGD on 12/17/2005 which was a normal examination. She was felt to have nonerosive GERD. 2. Chronic loose stools, likely bile acid related; started after GB resection.  Cholestyramine started 2016 has significantly helped.  HPI: This is a  very pleasant 45 year old woman whom I last saw many years ago. She was here in the office earlier this year seeing Cecille Rubin. Diarrhea, GERD  Chief complaint is diarrhea, GERD  Taking colestid one pill once daily: much better than her previous diarrhea.   She is considering trying to pills daily  protonix daily.  Ranitidine 150 nightly PRN.   Past Medical History  Diagnosis Date  . Restless leg syndrome   . Depression   . GERD (gastroesophageal reflux disease)   . IBS (irritable bowel syndrome)   . Migraines   . Anxiety     Past Surgical History  Procedure Laterality Date  . Termination of pregnanc      18 WEEKS--DOWNS SYNDROME  . Cholecystectomy      Current Outpatient Prescriptions  Medication Sig Dispense Refill  . ALPRAZolam (XANAX) 1 MG tablet Take 1 tablet (1 mg total) by mouth 2 (two) times daily as needed for anxiety. 60 tablet 1  . Cholecalciferol (VITAMIN D PO) Take 6,000 Units by mouth.      . colestipol (COLESTID) 1 g tablet Take 1 tablet (1 g total) by mouth 2 (two) times daily. 60 tablet 3  . Norgestimate-Ethinyl Estradiol Triphasic (ORTHO TRI-CYCLEN, 28,) 0.18/0.215/0.25 MG-35 MCG tablet Take 1 tablet by mouth daily. 28 tablet 12  . pantoprazole (PROTONIX) 40 MG tablet Take 1 tablet (40 mg total) by mouth daily. 30 tablet 1  . ranitidine (ZANTAC) 150 MG tablet Take 1 tablet (150 mg total) by mouth at bedtime. 30 tablet 6   No current facility-administered medications for this visit.    Allergies as of 08/06/2015 - Review Complete 08/06/2015  Allergen Reaction Noted  . Imitrex [sumatriptan]  02/16/2013  . Celexa [citalopram]  02/15/2013  .  Codeine  02/15/2013  . Viberzi [eluxadoline]  08/06/2015  . Zoloft [sertraline hcl]  02/15/2013    Family History  Problem Relation Age of Onset  . Diabetes Mother   . Hypertension Mother   . Heart attack Mother   . Hypertension Father   . CVA Maternal Grandfather     Social History   Social History  . Marital Status: Married    Spouse Name: N/A  . Number of Children: 1  . Years of Education: N/A   Occupational History  . Chief Financial Officer    Social History Main Topics  . Smoking status: Former Smoker    Quit date: 03/30/1996  . Smokeless tobacco: Never Used  . Alcohol Use: 0.0 oz/week    0 Standard drinks or equivalent per week     Comment: OCC  . Drug Use: No  . Sexual Activity: Yes    Birth Control/ Protection: Pill     Comment: 1st intercourse 45 yo-Fewer than 5 partners   Other Topics Concern  . Not on file   Social History Narrative     Physical Exam: BP 100/68 mmHg  Pulse 72  Ht 5\' 3"  (1.6 m)  Wt 128 lb (58.06 kg)  BMI 22.68 kg/m2  LMP 07/26/2015 (Approximate) Constitutional: generally well-appearing Psychiatric: alert and oriented x3 Abdomen: soft, nontender, nondistended, no obvious ascites, no peritoneal signs, normal bowel sounds   Assessment and plan: 45 y.o. female with  Diarrhea, GERD  Her loose stools are bile acid related. She has noticed significant improvement with cholestyramine, Colestid pills. She will take 1 pill once daily and knows she can take 2 pills she thinks that will help more. I'm happy to give her refills a year from now without need for return office visit. Her GERD is under good control on daily proton pump inhibitor and when necessary H2 blocker at bedtime. She has no alarm symptoms. She will be due for colon cancer screening starting in about 3 years and we'll put her in our recall system for that with colonoscopy.   Owens Loffler, MD [facility] 08/06/2015, 3:50 PM

## 2015-08-06 NOTE — Patient Instructions (Signed)
Continue protonix once daily.  Zantac (raniditine) at bedtime as needed. Colonoscopy for colon cancer screening 08/2018. Continue colestid once to twice. Call if you neded refills.

## 2015-08-14 ENCOUNTER — Encounter: Payer: Self-pay | Admitting: Internal Medicine

## 2015-08-14 ENCOUNTER — Ambulatory Visit (INDEPENDENT_AMBULATORY_CARE_PROVIDER_SITE_OTHER): Payer: 59 | Admitting: Internal Medicine

## 2015-08-14 VITALS — BP 96/62 | HR 76 | Temp 97.1°F | Resp 16 | Ht 63.0 in | Wt 128.8 lb

## 2015-08-14 DIAGNOSIS — Z0001 Encounter for general adult medical examination with abnormal findings: Secondary | ICD-10-CM

## 2015-08-14 DIAGNOSIS — Z136 Encounter for screening for cardiovascular disorders: Secondary | ICD-10-CM | POA: Diagnosis not present

## 2015-08-14 DIAGNOSIS — Z0000 Encounter for general adult medical examination without abnormal findings: Secondary | ICD-10-CM

## 2015-08-14 DIAGNOSIS — E559 Vitamin D deficiency, unspecified: Secondary | ICD-10-CM

## 2015-08-14 DIAGNOSIS — K219 Gastro-esophageal reflux disease without esophagitis: Secondary | ICD-10-CM

## 2015-08-14 DIAGNOSIS — IMO0001 Reserved for inherently not codable concepts without codable children: Secondary | ICD-10-CM

## 2015-08-14 DIAGNOSIS — R030 Elevated blood-pressure reading, without diagnosis of hypertension: Secondary | ICD-10-CM | POA: Diagnosis not present

## 2015-08-14 DIAGNOSIS — Z111 Encounter for screening for respiratory tuberculosis: Secondary | ICD-10-CM | POA: Diagnosis not present

## 2015-08-14 DIAGNOSIS — R5383 Other fatigue: Secondary | ICD-10-CM

## 2015-08-14 DIAGNOSIS — Z1212 Encounter for screening for malignant neoplasm of rectum: Secondary | ICD-10-CM

## 2015-08-14 DIAGNOSIS — E785 Hyperlipidemia, unspecified: Secondary | ICD-10-CM

## 2015-08-14 DIAGNOSIS — K589 Irritable bowel syndrome without diarrhea: Secondary | ICD-10-CM

## 2015-08-14 DIAGNOSIS — Z79899 Other long term (current) drug therapy: Secondary | ICD-10-CM

## 2015-08-14 DIAGNOSIS — R7303 Prediabetes: Secondary | ICD-10-CM | POA: Insufficient documentation

## 2015-08-14 NOTE — Patient Instructions (Signed)
Recommend Adult Low Dose Aspirin or   coated  Aspirin 81 mg daily   To reduce risk of Colon Cancer 20 %,   Skin Cancer 26 % ,   Melanoma 46%   and   Pancreatic cancer 60%   ++++++++++++++++++++++++++++++++++++++++++++++++++++++ Vitamin D goal   is between 70-100.   Please make sure that you are taking your Vitamin D as directed.   It is very important as a natural anti-inflammatory   helping hair, skin, and nails, as well as reducing stroke and heart attack risk.   It helps your bones and helps with mood.  It also decreases numerous cancer risks so please take it as directed.   Low Vit D is associated with a 200-300% higher risk for CANCER   and 200-300% higher risk for HEART   ATTACK  &  STROKE.   .....................................Marland Kitchen  It is also associated with higher death rate at younger ages,   autoimmune diseases like Rheumatoid arthritis, Lupus, Multiple Sclerosis.     Also many other serious conditions, like depression, Alzheimer's  Dementia, infertility, muscle aches, fatigue, fibromyalgia - just to name a few.  ++++++++++++++++++++++++++++++++++++++++++++++++  Recommend the book "The END of DIETING" by Dr Excell Seltzer   & the book "The END of DIABETES " by Dr Excell Seltzer  At [email or web address] - get book & Audio CD's     Being diabetic has a  300% increased risk for heart attack, stroke, cancer, and alzheimer- type vascular dementia. It is very important that you work harder with diet by avoiding all foods that are white. Avoid white rice (brown & wild rice is OK), white potatoes (sweetpotatoes in moderation is OK), White bread or wheat bread or anything made out of white flour like bagels, donuts, rolls, buns, biscuits, cakes, pastries, cookies, pizza crust, and pasta (made from white flour & egg whites) - vegetarian pasta or spinach or wheat pasta is OK. Multigrain breads like Arnold's or Pepper[employer], or multigrain sandwich thins or flatbreads.  Diet,  exercise and weight loss can reverse and cure diabetes in the early stages.  Diet, exercise and weight loss is very important in the control and prevention of complications of diabetes which affects every system in your body, ie. Brain - dementia/stroke, eyes - glaucoma/blindness, heart - heart attack/heart failure, kidneys - dialysis, stomach - gastric paralysis, intestines - malabsorption, nerves - severe painful neuritis, circulation - gangrene & loss of a leg(s), and finally cancer and Alzheimers.    I recommend avoid fried & greasy foods,  sweets/candy, white rice (brown or wild rice or Quinoa is OK), white potatoes (sweet potatoes are OK) - anything made from white flour - bagels, doughnuts, rolls, buns, biscuits,white and wheat breads, pizza crust and traditional pasta made of white flour & egg white(vegetarian pasta or spinach or wheat pasta is OK).  Multi-grain bread is OK - like multi-grain flat bread or sandwich thins. Avoid alcohol in excess. Exercise is also important.    Eat all the vegetables you want - avoid meat, especially red meat and dairy - especially cheese.  Cheese is the most concentrated form of trans-fats which is the worst thing to clog up our arteries. Veggie cheese is OK which can be found in the fresh produce section at [employer] or [employer] or [employer]  ++++++++++++++++++++++++++++++++++++++++++++++++++ DASH Eating Plan  DASH stands for "Dietary Approaches to Stop Hypertension."   The DASH eating plan is a healthy eating plan that has been shown to reduce high blood  pressure (hypertension). Additional health benefits may include reducing the risk of type 2 diabetes mellitus, heart disease, and stroke. The DASH eating plan may also help with weight loss.  WHAT DO I NEED TO KNOW ABOUT THE DASH EATING PLAN?  For the DASH eating plan, you will follow these general guidelines:  Choose foods with a percent daily value for sodium of less than 5% (as listed on the food  label).  Use salt-free seasonings or herbs instead of table salt or sea salt.  Check with your health care provider or pharmacist before using salt substitutes.  Eat lower-sodium products, often labeled as "lower sodium" or "no salt added."  Eat fresh foods.  Eat more vegetables, fruits, and low-fat dairy products.    Choose whole grains. Look for the word "whole" as the first word in the ingredient list.  Choose fish   Limit sweets, desserts, sugars, and sugary drinks.  Choose heart-healthy fats.  Eat veggie cheese   Eat more home-cooked food and less restaurant, buffet, and fast food.  Limit fried foods.  [place]uffaker foods using methods other than frying.  Limit canned vegetables. If you do use them, rinse them well to decrease the sodium.  When eating at a restaurant, ask that your food be prepared with less salt, or no salt if possible.                      WHAT FOODS CAN I EAT?  Read Dr Fara Olden Fuhrman's books on The End of Dieting & The End of Diabetes  Grains  Whole grain or whole wheat bread. Brown rice. Whole grain or whole wheat pasta. Quinoa, bulgur, and whole grain cereals. Low-sodium cereals. Corn or whole wheat flour tortillas. Whole grain cornbread. Whole grain crackers. Low-sodium crackers.  Vegetables  Fresh or frozen vegetables (raw, steamed, roasted, or grilled). Low-sodium or reduced-sodium tomato and vegetable juices. Low-sodium or reduced-sodium tomato sauce and paste. Low-sodium or reduced-sodium canned vegetables.   Fruits  All fresh, canned (in natural juice), or frozen fruits.  Protein Products   All fish and seafood.  Dried beans, peas, or lentils. Unsalted nuts and seeds. Unsalted canned beans.  Dairy  Low-fat dairy products, such as skim or 1% milk, 2% or reduced-fat cheeses, low-fat ricotta or cottage cheese, or plain low-fat yogurt. Low-sodium or reduced-sodium cheeses.  Fats and Oils  Tub margarines without trans fats. Light or  reduced-fat mayonnaise and salad dressings (reduced sodium). Avocado. Safflower, olive, or canola oils. Natural peanut or almond butter.  Other  Unsalted popcorn and pretzels. The items listed above may not be a complete list of recommended foods or beverages. Contact your dietitian for more options.  +++++++++++++++++++++++++++++++++++++++++++  WHAT FOODS ARE NOT RECOMMENDED?  Grains/ White flour or wheat flour  White bread. White pasta. White rice. Refined cornbread. Bagels and croissants. Crackers that contain trans fat.  Vegetables  Creamed or fried vegetables. Vegetables in a . Regular canned vegetables. Regular canned tomato sauce and paste. Regular tomato and vegetable juices.  Fruits  Dried fruits. Canned fruit in light or heavy syrup. Fruit juice.  Meat and Other Protein Products  Meat in general - RED mwaet & White meat.  Fatty cuts of meat. Ribs, chicken wings, bacon, sausage, bologna, salami, chitterlings, fatback, hot dogs, bratwurst, and packaged luncheon meats.  Dairy  Whole or 2% milk, cream, half-and-half, and cream cheese. Whole-fat or sweetened yogurt. Full-fat cheeses or blue cheese. Nondairy creamers and whipped toppings. Processed cheese, cheese spreads, or  cheese curds.  Condiments  Onion and garlic salt, seasoned salt, table salt, and sea salt. Canned and packaged gravies. Worcestershire sauce. Tartar sauce. Barbecue sauce. Teriyaki sauce. Soy sauce, including reduced sodium. Steak sauce. Fish sauce. Oyster sauce. Cocktail sauce. Horseradish. Ketchup and mustard. Meat flavorings and tenderizers. Bouillon cubes. Hot sauce. Tabasco sauce. Marinades. Taco seasonings. Relishes.  Fats and Oils Butter, stick margarine, lard, shortening and bacon fat. Coconut, palm kernel, or palm oils. Regular salad dressings.  Pickles and olives. Salted popcorn and pretzels.  The items listed above may not be a complete list of foods and beverages to avoid.   Preventive  Care for Adults  A healthy lifestyle and preventive care can promote health and wellness. Preventive health guidelines for women include the following key practices.  A routine yearly physical is a good way to check with your health care provider about your health and preventive screening. It is a chance to share any concerns and updates on your health and to receive a thorough exam.  Visit your dentist for a routine exam and preventive care every 6 months. Brush your teeth twice a day and floss once a day. Good oral hygiene prevents tooth decay and gum disease.  The frequency of eye exams is based on your age, health, family medical history, use of contact lenses, and other factors. Follow your health care provider's recommendations for frequency of eye exams.  Eat a healthy diet. Foods like vegetables, fruits, whole grains, low-fat dairy products, and lean protein foods contain the nutrients you need without too many calories. Decrease your intake of foods high in solid fats, added sugars, and salt. Eat the right amount of calories for you.Get information about a proper diet from your health care provider, if necessary.  Regular physical exercise is one of the most important things you can do for your health. Most adults should get at least 150 minutes of moderate-intensity exercise (any activity that increases your heart rate and causes you to sweat) each week. In addition, most adults need muscle-strengthening exercises on 2 or more days a week.  Maintain a healthy weight. The body mass index (BMI) is a screening tool to identify possible weight problems. It provides an estimate of body fat based on height and weight. Your health care provider can find your BMI and can help you achieve or maintain a healthy weight.For adults 20 years and older:  A BMI below 18.5 is considered underweight.  A BMI of 18.5 to 24.9 is normal.  A BMI of 25 to 29.9 is considered overweight.  A BMI of 30 and  above is considered obese.  Maintain normal blood lipids and cholesterol levels by exercising and minimizing your intake of saturated fat. Eat a balanced diet with plenty of fruit and vegetables. Blood tests for lipids and cholesterol should begin at age 21 and be repeated every 5 years. If your lipid or cholesterol levels are high, you are over 50, or you are at high risk for heart disease, you may need your cholesterol levels checked more frequently.Ongoing high lipid and cholesterol levels should be treated with medicines if diet and exercise are not working.  If you smoke, find out from your health care provider how to quit. If you do not use tobacco, do not start.  Lung cancer screening is recommended for adults aged 31-80 years who are at high risk for developing lung cancer because of a history of smoking. A yearly low-dose CT scan of the lungs is recommended  for people who have at least a 30-pack-year history of smoking and are a current smoker or have quit within the past 15 years. A pack year of smoking is smoking an average of 1 pack of cigarettes a day for 1 year (for example: 1 pack a day for 30 years or 2 packs a day for 15 years). Yearly screening should continue until the smoker has stopped smoking for at least 15 years. Yearly screening should be stopped for people who develop a health problem that would prevent them from having lung cancer treatment.  High blood pressure causes heart disease and increases the risk of stroke. Your blood pressure should be checked at least every 1 to 2 years. Ongoing high blood pressure should be treated with medicines if weight loss and exercise do not work.  If you are 30-80 years old, ask your health care provider if you should take aspirin to prevent strokes.  Diabetes screening involves taking a blood sample to check your fasting blood sugar level. This should be done once every 3 years, after age 61, if you are within normal weight and without risk  factors for diabetes. Testing should be considered at a younger age or be carried out more frequently if you are overweight and have at least 1 risk factor for diabetes.  Breast cancer screening is essential preventive care for women. You should practice "breast self-awareness." This means understanding the normal appearance and feel of your breasts and may include breast self-examination. Any changes detected, no matter how small, should be reported to a health care provider. Women in their 77s and 30s should have a clinical breast exam (CBE) by a health care provider as part of a regular health exam every 1 to 3 years. After age 81, women should have a CBE every year. Starting at age 20, women should consider having a mammogram (breast X-ray test) every year. Women who have a family history of breast cancer should talk to their health care provider about genetic screening. Women at a high risk of breast cancer should talk to their health care providers about having an MRI and a mammogram every year.  Breast cancer gene (BRCA)-related cancer risk assessment is recommended for women who have family members with BRCA-related cancers. BRCA-related cancers include breast, ovarian, tubal, and peritoneal cancers. Having family members with these cancers may be associated with an increased risk for harmful changes (mutations) in the breast cancer genes BRCA1 and BRCA2. Results of the assessment will determine the need for genetic counseling and BRCA1 and BRCA2 testing.  Routine pelvic exams to screen for cancer are no longer recommended for nonpregnant women who are considered low risk for cancer of the pelvic organs (ovaries, uterus, and vagina) and who do not have symptoms. Ask your health care provider if a screening pelvic exam is right for you.  If you have had past treatment for cervical cancer or a condition that could lead to cancer, you need Pap tests and screening for cancer for at least 20 years after  your treatment. If Pap tests have been discontinued, your risk factors (such as having a new sexual partner) need to be reassessed to determine if screening should be resumed. Some women have medical problems that increase the chance of getting cervical cancer. In these cases, your health care provider may recommend more frequent screening and Pap tests.  Colorectal cancer can be detected and often prevented. Most routine colorectal cancer screening begins at the age of 22 years and  continues through age 74 years. However, your health care provider may recommend screening at an earlier age if you have risk factors for colon cancer. On a yearly basis, your health care provider may provide home test kits to check for hidden blood in the stool. Use of a small camera at the end of a tube, to directly examine the colon (sigmoidoscopy or colonoscopy), can detect the earliest forms of colorectal cancer. Talk to your health care provider about this at age 22, when routine screening begins. Direct exam of the colon should be repeated every 5-10 years through age 76 years, unless early forms of pre-cancerous polyps or small growths are found.  Hepatitis C blood testing is recommended for all people born from [birth year] through 1967 and any individual with known risks for hepatitis C.  Pra  Osteoporosis is a disease in which the bones lose minerals and strength with aging. This can result in serious bone fractures or breaks. The risk of osteoporosis can be identified using a bone density scan. Women ages 88 years and over and women at risk for fractures or osteoporosis should discuss screening with their health care providers. Ask your health care provider whether you should take a calcium supplement or vitamin D to reduce the rate of osteoporosis.  Menopause can be associated with physical symptoms and risks. Hormone replacement therapy is available to decrease symptoms and risks. You should talk to your health care  provider about whether hormone replacement therapy is right for you.  Use sunscreen. Apply sunscreen liberally and repeatedly throughout the day. You should seek shade when your shadow is shorter than you. Protect yourself by wearing long sleeves, pants, a wide-brimmed hat, and sunglasses year round, whenever you are outdoors.  Once a month, do a whole body skin exam, using a mirror to look at the skin on your back. Tell your health care provider of new moles, moles that have irregular borders, moles that are larger than a pencil eraser, or moles that have changed in shape or color.  Stay current with required vaccines (immunizations).  Influenza vaccine. All adults should be immunized every year.  Tetanus, diphtheria, and acellular pertussis (Td, Tdap) vaccine. Pregnant women should receive 1 dose of Tdap vaccine during each pregnancy. The dose should be obtained regardless of the length of time since the last dose. Immunization is preferred during the 27th-36th week of gestation. An adult who has not previously received Tdap or who does not know her vaccine status should receive 1 dose of Tdap. This initial dose should be followed by tetanus and diphtheria toxoids (Td) booster doses every 10 years. Adults with an unknown or incomplete history of completing a 3-dose immunization series with Td-containing vaccines should begin or complete a primary immunization series including a Tdap dose. Adults should receive a Td booster every 10 years.  Varicella vaccine. An adult without evidence of immunity to varicella should receive 2 doses or a second dose if she has previously received 1 dose. Pregnant females who do not have evidence of immunity should receive the first dose after pregnancy. This first dose should be obtained before leaving the health care facility. The second dose should be obtained 4-8 weeks after the first dose.  Human papillomavirus (HPV) vaccine. Females aged 13-26 years who have not  received the vaccine previously should obtain the 3-dose series. The vaccine is not recommended for use in pregnant females. However, pregnancy testing is not needed before receiving a dose. If a female is found to  be pregnant after receiving a dose, no treatment is needed. In that case, the remaining doses should be delayed until after the pregnancy. Immunization is recommended for any person with an immunocompromised condition through the age of 14 years if she did not get any or all doses earlier. During the 3-dose series, the second dose should be obtained 4-8 weeks after the first dose. The third dose should be obtained 24 weeks after the first dose and 16 weeks after the second dose.  Zoster vaccine. One dose is recommended for adults aged 87 years or older unless certain conditions are present.  Measles, mumps, and rubella (MMR) vaccine. Adults born before [birth year] generally are considered immune to measles and mumps. Adults born in [birth year] or later should have 1 or more doses of MMR vaccine unless there is a contraindication to the vaccine or there is laboratory evidence of immunity to each of the three diseases. A routine second dose of MMR vaccine should be obtained at least 28 days after the first dose for students attending postsecondary schools, health care workers, or international travelers. People who received inactivated measles vaccine or an unknown type of measles vaccine during 1963-1967 should receive 2 doses of MMR vaccine. People who received inactivated mumps vaccine or an unknown type of mumps vaccine before 1979 and are at high risk for mumps infection should consider immunization with 2 doses of MMR vaccine. For females of childbearing age, rubella immunity should be determined. If there is no evidence of immunity, females who are not pregnant should be vaccinated. If there is no evidence of immunity, females who are pregnant should delay immunization until after pregnancy. Unvaccinated  health care workers born before [birth year] who lack laboratory evidence of measles, mumps, or rubella immunity or laboratory confirmation of disease should consider measles and mumps immunization with 2 doses of MMR vaccine or rubella immunization with 1 dose of MMR vaccine.  Pneumococcal 13-valent conjugate (PCV13) vaccine. When indicated, a person who is uncertain of her immunization history and has no record of immunization should receive the PCV13 vaccine. An adult aged 48 years or older who has certain medical conditions and has not been previously immunized should receive 1 dose of PCV13 vaccine. This PCV13 should be followed with a dose of pneumococcal polysaccharide (PPSV23) vaccine. The PPSV23 vaccine dose should be obtained at least 8 weeks after the dose of PCV13 vaccine. An adult aged 40 years or older who has certain medical conditions and previously received 1 or more doses of PPSV23 vaccine should receive 1 dose of PCV13. The PCV13 vaccine dose should be obtained 1 or more years after the last PPSV23 vaccine dose.    Pneumococcal polysaccharide (PPSV23) vaccine. When PCV13 is also indicated, PCV13 should be obtained first. All adults aged 66 years and older should be immunized. An adult younger than age 28 years who has certain medical conditions should be immunized. Any person who resides in a nursing home or long-term care facility should be immunized. An adult smoker should be immunized. People with an immunocompromised condition and certain other conditions should receive both PCV13 and PPSV23 vaccines. People with human immunodeficiency virus (HIV) infection should be immunized as soon as possible after diagnosis. Immunization during chemotherapy or radiation therapy should be avoided. Routine use of PPSV23 vaccine is not recommended for American Indians, [place] Natives, or people younger than 65 years unless there are medical conditions that require PPSV23 vaccine. When indicated, people who  have unknown immunization and have no record of  immunization should receive PPSV23 vaccine. One-time revaccination 5 years after the first dose of PPSV23 is recommended for people aged 19-64 years who have chronic kidney failure, nephrotic syndrome, asplenia, or immunocompromised conditions. People who received 1-2 doses of PPSV23 before age 15 years should receive another dose of PPSV23 vaccine at age 40 years or later if at least 5 years have passed since the previous dose. Doses of PPSV23 are not needed for people immunized with PPSV23 at or after age 68 years.  Preventive Services / Frequency   Ages 67 to 44 years  Blood pressure check.  Lipid and cholesterol check.  Lung cancer screening. / Every year if you are aged 37-80 years and have a 30-pack-year history of smoking and currently smoke or have quit within the past 15 years. Yearly screening is stopped once you have quit smoking for at least 15 years or develop a health problem that would prevent you from having lung cancer treatment.  Clinical breast exam.** / Every year after age 56 years.  BRCA-related cancer risk assessment.** / For women who have family members with a BRCA-related cancer (breast, ovarian, tubal, or peritoneal cancers).  Mammogram.** / Every year beginning at age 45 years and continuing for as long as you are in good health. Consult with your health care provider.  Pap test.** / Every 3 years starting at age 57 years through age 39 or 44 years with a history of 3 consecutive normal Pap tests.  HPV screening.** / Every 3 years from ages 39 years through ages 21 to 65 years with a history of 3 consecutive normal Pap tests.  Fecal occult blood test (FOBT) of stool. / Every year beginning at age 80 years and continuing until age 60 years. You may not need to do this test if you get a colonoscopy every 10 years.  Flexible sigmoidoscopy or colonoscopy.** / Every 5 years for a flexible sigmoidoscopy or every 10 years  for a colonoscopy beginning at age 44 years and continuing until age 71 years.  Hepatitis C blood test.** / For all people born from [birth year] through 1967 and any individual with known risks for hepatitis C.  Skin self-exam. / Monthly.  Influenza vaccine. / Every year.  Tetanus, diphtheria, and acellular pertussis (Tdap/Td) vaccine.** / Consult your health care provider. Pregnant women should receive 1 dose of Tdap vaccine during each pregnancy. 1 dose of Td every 10 years.  Varicella vaccine.** / Consult your health care provider. Pregnant females who do not have evidence of immunity should receive the first dose after pregnancy.  Zoster vaccine.** / 1 dose for adults aged 30 years or older.  Pneumococcal 13-valent conjugate (PCV13) vaccine.** / Consult your health care provider.  Pneumococcal polysaccharide (PPSV23) vaccine.** / 1 to 2 doses if you smoke cigarettes or if you have certain conditions.  Meningococcal vaccine.** / Consult your health care provider.  Hepatitis A vaccine.** / Consult your health care provider.  Hepatitis B vaccine.** / Consult your health care provider. Screening for abdominal aortic aneurysm (AAA)  by ultrasound is recommended for people over 50 who have history of high blood pressure or who are current or former smokers.

## 2015-08-14 NOTE — Progress Notes (Signed)
Patient ID: Tina Holmes, female   DOB: [birth year], 45 y.o.   MRN: [id number]  [facility] ADULT & ADOLESCENT [facility]NAL MEDICINE                   Unk Pinto, M.D.    Uvaldo Bristle. Silverio Lay, P.A.-C      Starlyn Skeans, P.A.-C  [facility]                [address] [place], [place] [id number] Telephone [phone] Telefax [phone]  __________________________________________________________________________  Annual Screening/Preventative Visit And Comprehensive Evaluation &  Examination     This very nice 45 y.o. MWF presents for a Wellness/Preventative Visit & comprehensive evaluation  of multiple medical co-morbidities.  Patient is screened for  Elevated BP, Cholesterol,  Prediabetes and Vitamin D Deficiency. Patient also has hx/o IBS and currently is on treatment for GERD with control of sx's. Patient has hx/o GERD controlled with Pantoprazole & Ranitidine and also is on Colestipol wih control of IBS-D.       Patient's BP has been controlled and today's BP is  96/62 mmHg. Patient denies any cardiac symptoms as chest pain, palpitations, shortness of breath, dizziness or ankle swelling. Ptient does exercise regularly.      Patient's hyperlipidemia is controlled with diet. Current  lipids are at goal with Cholesterol 172; HDL 67; LDL 82; Triglycerides 114.     Patient has prediabetes predating since 2012 with A1c 5.8% and patient denies reactive hypoglycemic symptoms, visual blurring, diabetic polys, or paresthesias. Current A1c is borderline at 5.7% on 08/14/2014.     Finally, patient has history of Vitamin D Deficiency with Vit D 40 in 2009  and last Vitamin D was 42 on 08/14/2014.    Medication Sig  . ALPRAZolam 1 MG tablet Take 1 tablet (1 mg total) by mouth 2 (two) times daily as needed for anxiety.  Marland Kitchen VITAMIN D  Take 6,000 Units by mouth.    . colestipol  1 g tablet Take 1 tablet (1 g total) by mouth 2 (two) times daily.(for IBS)   . ORTHO TRI-CYCLEN - 28 Take 1 tablet by mouth daily.  . pantoprazole  40 MG tablet Take 1 tablet (40 mg total) by mouth daily.  . ranitidine  150 MG tablet Take 1 tablet (150 mg total) by mouth at bedtime.   Allergies  Allergen Reactions  . Imitrex [Sumatriptan]     Throat swelling  . Celexa [Citalopram]     Decreased libido  . Codeine     N/V  . Viberzi [Eluxadoline]     Throat swelling   . Zoloft [Sertraline Hcl]     N/V   Past Medical History  Diagnosis Date  . Restless leg syndrome   . Depression   . GERD (gastroesophageal reflux disease)   . IBS (irritable bowel syndrome)   . Migraines   . Anxiety    Health Maintenance  Topic Date Due  . INFLUENZA VACCINE  10/29/2015  . PAP SMEAR  05/21/2018  . TETANUS/TDAP  08/13/2024  . HIV Screening  Completed   Immunization History  Administered Date(s) Administered  . Influenza Split 01/14/2011, 02/16/2013, 01/10/2015  . Influenza-Unspecified 02/11/2014  . PPD Test 08/10/2013, 08/14/2014  . Pneumococcal-Unspecified 07/29/2010  . Td 03/31/2003  . Tdap 08/14/2014   Past Surgical History  Procedure Laterality Date  . Termination of pregnanc  18 WEEKS--DOWNS SYNDROME  . Cholecystectomy     Family History  Problem Relation Age of Onset  . Diabetes Mother   . Hypertension Mother   . Heart attack Mother   . Hypertension Father   . CVA Maternal Grandfather    Social History  Substance Use Topics  . Smoking status: Former Smoker    Quit date: 03/30/1996  . Smokeless tobacco: Never Used  . Alcohol Use: 0.0 oz/week    0 Standard drinks or equivalent per week     Comment: OCC    ROS Constitutional: Denies fever, chills, weight loss/gain, headaches, insomnia,  night sweats, and change in appetite. Does c/o fatigue. Eyes: Denies redness, blurred vision, diplopia, discharge, itchy, watery eyes.  ENT: Denies discharge, congestion, post nasal drip, epistaxis, sore throat, earache, hearing loss, dental pain,  Tinnitus, Vertigo, Sinus pain, snoring.  Cardio: Denies chest pain, palpitations, irregular heartbeat, syncope, dyspnea, diaphoresis, orthopnea, PND, claudication, edema Respiratory: denies cough, dyspnea, DOE, pleurisy, hoarseness, laryngitis, wheezing.  Gastrointestinal: Denies dysphagia, heartburn, reflux, water brash, pain, cramps, nausea, vomiting, bloating, diarrhea, constipation, hematemesis, melena, hematochezia, jaundice, hemorrhoids Genitourinary: Denies dysuria, frequency, urgency, nocturia, hesitancy, discharge, hematuria, flank pain Breast: Breast lumps, nipple discharge, bleeding.  Musculoskeletal: Denies arthralgia, myalgia, stiffness, Jt. Swelling, pain, limp, and strain/sprain. Denies falls. Skin: Denies puritis, rash, hives, warts, acne, eczema, changing in skin lesion Neuro: No weakness, tremor, incoordination, spasms, paresthesia, pain Psychiatric: Denies confusion, memory loss, sensory loss. Denies Depression. Endocrine: Denies change in weight, skin, hair change, nocturia, and paresthesia, diabetic polys, visual blurring, hyper / hypo glycemic episodes.  Heme/Lymph: No excessive bleeding, bruising, enlarged lymph nodes.  Physical Exam  BP 96/62 mmHg  Pulse 76  Temp(Src) 97.1 F (36.2 C)  Resp 16  Ht 5\' 3"  (1.6 m)  Wt 128 lb 12.8 oz (58.423 kg)  BMI 22.82 kg/m2  LMP 07/26/2015 (Approximate)  General Appearance: Well nourished and in no apparent distress. Eyes: PERRLA, EOMs, conjunctiva no swelling or erythema, normal fundi and vessels. Sinuses: No frontal/maxillary tenderness ENT/Mouth: EACs patent / TMs  nl. Nares clear without erythema, swelling, mucoid exudates. Oral hygiene is good. No erythema, swelling, or exudate. Tongue normal, non-obstructing. Tonsils not swollen or erythematous. Hearing normal.  Neck: Supple, thyroid normal. No bruits, nodes or JVD. Respiratory: Respiratory effort normal.  BS equal and clear bilateral without rales, rhonci, wheezing or  stridor. Cardio: Heart sounds are normal with regular rate and rhythm and no murmurs, rubs or gallops. Peripheral pulses are normal and equal bilaterally without edema. No aortic or femoral bruits. Chest: symmetric with normal excursions and percussion. Breasts: Symmetric, without lumps, nipple discharge, retractions, or fibrocystic changes.  Abdomen: Flat, soft with bowel sounds active. Nontender, no guarding, rebound, hernias, masses, or organomegaly.  Lymphatics: Non tender without lymphadenopathy.  Musculoskeletal: Full ROM all peripheral extremities, joint stability, 5/5 strength, and normal gait. Skin: Warm and dry without rashes, lesions, cyanosis, clubbing or  ecchymosis.  Neuro: Cranial nerves intact, reflexes equal bilaterally. Normal muscle tone, no cerebellar symptoms. Sensation intact.  Pysch: Alert and oriented X 3, normal affect, Insight and Judgment appropriate.   Assessment and Plan  1. Annual Preventative Screening Examination  - Microalbumin / creatinine urine ratio - EKG 12-Lead - POC Hemoccult Bld/Stl  - Urinalysis, Routine w reflex microscopic  - Vitamin B12 - Iron and TIBC - CBC with Differential/Platelet - BASIC METABOLIC PANEL WITH GFR - Hepatic function panel - Magnesium - Lipid panel - TSH - Hemoglobin A1c - Insulin, random - VITAMIN  D 25 Hydroxy  2. Elevated BP  - Microalbumin / creatinine urine ratio - EKG 12-Lead - TSH  3. Elevated lipids  - Lipid panel - TSH  4. Prediabetes  - Hemoglobin A1c - Insulin, random  5. Vitamin D deficiency  - VITAMIN D 25 Hydroxy   6. Gastroesophageal reflux disease   7. IBS (irritable bowel syndrome)   8. Screening for rectal cancer  - POC Hemoccult Bld/Stl   9. Other fatigue  - Vitamin B12 - Iron and TIBC - CBC with Differential/Platelet - TSH  10. Medication management  - Urinalysis, Routine w reflex microscopic - CBC with Differential/Platelet - BASIC METABOLIC PANEL WITH GFR -  Hepatic function panel - Magnesium  11. Screening for ischemic heart disease   Continue prudent diet as discussed, weight control, BP monitoring, regular exercise, and medications. Discussed med's effects and SE's. Screening labs and tests as requested with regular follow-up as recommended. Over 40 minutes of exam, counseling, chart review and high complex critical decision making was performed.

## 2015-08-15 DIAGNOSIS — Z111 Encounter for screening for respiratory tuberculosis: Secondary | ICD-10-CM | POA: Diagnosis not present

## 2015-08-15 LAB — URINALYSIS, MICROSCOPIC ONLY
Bacteria, UA: NONE SEEN [HPF]
Casts: NONE SEEN [LPF]
Crystals: NONE SEEN [HPF]
RBC / HPF: NONE SEEN RBC/HPF (ref <=2)
WBC, UA: NONE SEEN WBC/HPF (ref <=5)
YEAST: NONE SEEN [HPF]

## 2015-08-15 LAB — HEPATIC FUNCTION PANEL
ALBUMIN: 3.7 g/dL (ref 3.6–5.1)
ALK PHOS: 58 U/L (ref 33–115)
ALT: 17 U/L (ref 6–29)
AST: 20 U/L (ref 10–35)
BILIRUBIN TOTAL: 1 mg/dL (ref 0.2–1.2)
Bilirubin, Direct: 0.2 mg/dL (ref <=0.2)
Indirect Bilirubin: 0.8 mg/dL (ref 0.2–1.2)
Total Protein: 6.8 g/dL (ref 6.1–8.1)

## 2015-08-15 LAB — HEMOGLOBIN A1C
Hgb A1c MFr Bld: 5.7 % — ABNORMAL HIGH (ref <5.7)
Mean Plasma Glucose: 117 mg/dL

## 2015-08-15 LAB — CBC WITH DIFFERENTIAL/PLATELET
BASOS ABS: 0 {cells}/uL (ref 0–200)
BASOS PCT: 0 %
EOS ABS: 118 {cells}/uL (ref 15–500)
Eosinophils Relative: 2 %
HEMATOCRIT: 35.4 % (ref 35.0–45.0)
HEMOGLOBIN: 11.5 g/dL — AB (ref 11.7–15.5)
Lymphocytes Relative: 23 %
Lymphs Abs: 1357 cells/uL (ref 850–3900)
MCH: 29 pg (ref 27.0–33.0)
MCHC: 32.5 g/dL (ref 32.0–36.0)
MCV: 89.4 fL (ref 80.0–100.0)
MONO ABS: 472 {cells}/uL (ref 200–950)
MPV: 9.7 fL (ref 7.5–12.5)
Monocytes Relative: 8 %
NEUTROS ABS: 3953 {cells}/uL (ref 1500–7800)
Neutrophils Relative %: 67 %
Platelets: 298 10*3/uL (ref 140–400)
RBC: 3.96 MIL/uL (ref 3.80–5.10)
RDW: 13.7 % (ref 11.0–15.0)
WBC: 5.9 10*3/uL (ref 3.8–10.8)

## 2015-08-15 LAB — MICROALBUMIN / CREATININE URINE RATIO
Creatinine, Urine: 144 mg/dL (ref 20–320)
MICROALB/CREAT RATIO: 3 ug/mg{creat} (ref <30)
Microalb, Ur: 0.4 mg/dL

## 2015-08-15 LAB — BASIC METABOLIC PANEL WITH GFR
BUN: 12 mg/dL (ref 7–25)
CHLORIDE: 101 mmol/L (ref 98–110)
CO2: 23 mmol/L (ref 20–31)
Calcium: 8.7 mg/dL (ref 8.6–10.2)
Creat: 0.69 mg/dL (ref 0.50–1.10)
GFR, Est African American: >89 mL/min (ref >=60)
GFR, Est Non African American: >89 mL/min (ref >=60)
GLUCOSE: 111 mg/dL — AB (ref 65–99)
POTASSIUM: 4.5 mmol/L (ref 3.5–5.3)
Sodium: 134 mmol/L — ABNORMAL LOW (ref 135–146)

## 2015-08-15 LAB — LIPID PANEL
Cholesterol: 150 mg/dL (ref 125–200)
HDL: 66 mg/dL (ref >=46)
LDL CALC: 66 mg/dL (ref <130)
Total CHOL/HDL Ratio: 2.3 Ratio (ref <=5.0)
Triglycerides: 90 mg/dL (ref <150)
VLDL: 18 mg/dL (ref <30)

## 2015-08-15 LAB — URINALYSIS, ROUTINE W REFLEX MICROSCOPIC
BILIRUBIN URINE: NEGATIVE
GLUCOSE, UA: NEGATIVE
HGB URINE DIPSTICK: NEGATIVE
Ketones, ur: NEGATIVE
Nitrite: NEGATIVE
PROTEIN: NEGATIVE
Specific Gravity, Urine: 1.021 (ref 1.001–1.035)
pH: 6 (ref 5.0–8.0)

## 2015-08-15 LAB — MAGNESIUM: Magnesium: 1.8 mg/dL (ref 1.5–2.5)

## 2015-08-15 LAB — INSULIN, RANDOM: INSULIN: 22.1 u[IU]/mL — AB (ref 2.0–19.6)

## 2015-08-15 LAB — VITAMIN D 25 HYDROXY (VIT D DEFICIENCY, FRACTURES): VIT D 25 HYDROXY: 47 ng/mL (ref 30–100)

## 2015-08-15 LAB — IRON AND TIBC
%SAT: 16 % (ref 11–50)
Iron: 70 ug/dL (ref 40–190)
TIBC: 426 ug/dL (ref 250–450)
UIBC: 356 ug/dL (ref 125–400)

## 2015-08-15 LAB — TSH: TSH: 1.39 m[IU]/L

## 2015-08-15 LAB — VITAMIN B12: VITAMIN B 12: 247 pg/mL (ref 200–1100)

## 2015-08-16 LAB — TB SKIN TEST
Induration: 0 mm
TB Skin Test: NEGATIVE

## 2015-08-17 ENCOUNTER — Encounter: Payer: Self-pay | Admitting: Internal Medicine

## 2015-08-30 ENCOUNTER — Other Ambulatory Visit: Payer: Self-pay | Admitting: Gastroenterology

## 2015-10-03 ENCOUNTER — Other Ambulatory Visit: Payer: Self-pay | Admitting: *Deleted

## 2015-10-03 DIAGNOSIS — Z0001 Encounter for general adult medical examination with abnormal findings: Secondary | ICD-10-CM

## 2015-10-03 DIAGNOSIS — Z1212 Encounter for screening for malignant neoplasm of rectum: Secondary | ICD-10-CM

## 2015-10-03 LAB — POC HEMOCCULT BLD/STL (HOME/3-CARD/SCREEN)
FECAL OCCULT BLD: NEGATIVE
FECAL OCCULT BLD: NEGATIVE
FECAL OCCULT BLD: NEGATIVE

## 2015-10-25 ENCOUNTER — Other Ambulatory Visit: Payer: Self-pay | Admitting: Gastroenterology

## 2015-11-24 ENCOUNTER — Other Ambulatory Visit: Payer: Self-pay | Admitting: Gastroenterology

## 2015-12-21 ENCOUNTER — Other Ambulatory Visit: Payer: Self-pay | Admitting: Gastroenterology

## 2016-01-28 ENCOUNTER — Other Ambulatory Visit: Payer: Self-pay | Admitting: Gynecology

## 2016-01-28 DIAGNOSIS — Z1231 Encounter for screening mammogram for malignant neoplasm of breast: Secondary | ICD-10-CM

## 2016-02-22 ENCOUNTER — Other Ambulatory Visit: Payer: Self-pay | Admitting: Gastroenterology

## 2016-03-12 ENCOUNTER — Ambulatory Visit
Admission: RE | Admit: 2016-03-12 | Discharge: 2016-03-12 | Disposition: A | Discharge status: Home / Self Care | Payer: 59 | Source: Ambulatory Visit | Attending: Gynecology | Admitting: Gynecology

## 2016-03-12 DIAGNOSIS — Z1231 Encounter for screening mammogram for malignant neoplasm of breast: Secondary | ICD-10-CM

## 2016-04-18 ENCOUNTER — Other Ambulatory Visit: Payer: Self-pay | Admitting: Gastroenterology

## 2016-04-27 ENCOUNTER — Other Ambulatory Visit: Payer: Self-pay | Admitting: Gastroenterology

## 2016-04-27 DIAGNOSIS — Z1283 Encounter for screening for malignant neoplasm of skin: Secondary | ICD-10-CM | POA: Diagnosis not present

## 2016-04-27 DIAGNOSIS — L308 Other specified dermatitis: Secondary | ICD-10-CM | POA: Diagnosis not present

## 2016-05-11 ENCOUNTER — Other Ambulatory Visit: Payer: Self-pay | Admitting: Gynecology

## 2016-05-25 ENCOUNTER — Ambulatory Visit (INDEPENDENT_AMBULATORY_CARE_PROVIDER_SITE_OTHER): Payer: 59 | Admitting: Gynecology

## 2016-05-25 ENCOUNTER — Encounter: Payer: Self-pay | Admitting: Gynecology

## 2016-05-25 VITALS — BP 120/74 | Ht 63.0 in | Wt 131.0 lb

## 2016-05-25 DIAGNOSIS — Z01419 Encounter for gynecological examination (general) (routine) without abnormal findings: Secondary | ICD-10-CM | POA: Diagnosis not present

## 2016-05-25 DIAGNOSIS — R61 Generalized hyperhidrosis: Secondary | ICD-10-CM | POA: Diagnosis not present

## 2016-05-25 LAB — TSH: TSH: 3.46 mIU/L

## 2016-05-25 MED ORDER — NORGESTIM-ETH ESTRAD TRIPHASIC 0.18/0.215/0.25 MG-35 MCG PO TABS: 1.0000 | ORAL_TABLET | Freq: Every day | ORAL | 12 refills | Status: DC

## 2016-05-25 NOTE — Addendum Note (Signed)
Addended by: Nelva Nay on: 05/25/2016 03:20 PM   Modules accepted: Orders

## 2016-05-25 NOTE — Patient Instructions (Signed)

## 2016-05-25 NOTE — Progress Notes (Signed)
    Mckinley L Hehr 22-Apr-1970 [id number]        46 y.o.  G2P0011 for annual exam.  Also complaining of some night sweats. She'll note waking up where she is wet. Does not consistently occur at a time throughout the month. Happens when she is on active pills as well as during her pill free week. She'll go weeks without it happening. No skin hair or weight changes. No nausea vomiting diarrhea constipation.  Past medical history,surgical history, problem list, medications, allergies, family history and social history were all reviewed and documented as reviewed in the EPIC chart.  ROS:  Performed with pertinent positives and negatives included in the history, assessment and plan.   Additional significant findings :  none   Exam: Caryn Bee assistant Vitals:   05/25/16 1451  BP: 120/74  Weight: 131 lb (59.4 kg)  Height: 5\' 3"  (1.6 m)   Body mass index is 23.21 kg/m.  General appearance:  Normal affect, orientation and appearance. Skin: Grossly normal HEENT: Without gross lesions.  No cervical or supraclavicular adenopathy. Thyroid normal.  Lungs:  Clear without wheezing, rales or rhonchi Cardiac: RR, without RMG Abdominal:  Soft, nontender, without masses, guarding, rebound, organomegaly or hernia Breasts:  Examined lying and sitting without masses, retractions, discharge or axillary adenopathy. Pelvic:  Ext, BUS, Vagina: normal  Cervix: normal. Pap smear done  Uterus: anteverted, normal size, shape and contour, midline and mobile nontender   Adnexa: Without masses or tenderness    Anus and perineum: Normal   Rectovaginal: Normal sphincter tone without palpated masses or tenderness.    Assessment/Plan:  46 y.o. G62P0011 female for annual exam with regular menses, oral contraceptives.   1. Oral contraceptives. Doing well on Ortho Tri-Cyclen equivalent and wants to continue. We discussed thrombosis risk such as stroke heart attack DVT. Patient comfortable continuing I refilled  her 1 year. 2. Pap smear 2017. Pap/HPV 2013. Patient uncomfortable with less frequent screening intervals. Pap smear done today. No history of significant abnormal Pap smears. 3. Mammography 02/2016. Continue with annual mammography when due. SBE monthly reviewed. 4. Night sweats. Occurring sporadically throughout the month. Does not seem to time to oral contraceptives. No other associated symptoms. Will check baseline TSH. Will follow up with primary physician if continue/worsens to be an issue. 5. Health maintenance. No routine lab work done as patient reports this done elsewhere. Follow up 1 year, sooner as needed.   Anastasio Auerbach MD, 3:08 PM 05/25/2016

## 2016-05-26 ENCOUNTER — Encounter: Payer: Self-pay | Admitting: Gynecology

## 2016-05-26 LAB — PAP IG W/ RFLX HPV ASCU

## 2016-06-26 ENCOUNTER — Other Ambulatory Visit: Payer: Self-pay | Admitting: Gastroenterology

## 2016-06-29 NOTE — Telephone Encounter (Signed)
Medication refilled for 1 year #30 with 11 refills

## 2016-07-20 ENCOUNTER — Encounter (HOSPITAL_COMMUNITY): Payer: Self-pay | Admitting: Emergency Medicine

## 2016-07-20 ENCOUNTER — Ambulatory Visit (HOSPITAL_COMMUNITY)
Admission: EM | Admit: 2016-07-20 | Discharge: 2016-07-20 | Disposition: A | Discharge status: Home / Self Care | Payer: 59 | Attending: Family Medicine | Admitting: Family Medicine

## 2016-07-20 DIAGNOSIS — Z23 Encounter for immunization: Secondary | ICD-10-CM | POA: Diagnosis not present

## 2016-07-20 DIAGNOSIS — W228XXA Striking against or struck by other objects, initial encounter: Secondary | ICD-10-CM | POA: Diagnosis not present

## 2016-07-20 DIAGNOSIS — S91112A Laceration without foreign body of left great toe without damage to nail, initial encounter: Secondary | ICD-10-CM

## 2016-07-20 MED ORDER — TETANUS-DIPHTH-ACELL PERTUSSIS 5-2.5-18.5 LF-MCG/0.5 IM SUSP
0.5000 mL | Freq: Once | INTRAMUSCULAR | Status: AC
  Administered 2016-07-20: 0.5 mL via INTRAMUSCULAR

## 2016-07-20 MED ORDER — TETANUS-DIPHTH-ACELL PERTUSSIS 5-2.5-18.5 LF-MCG/0.5 IM SUSP
INTRAMUSCULAR | Status: AC
  Filled 2016-07-20: qty 0.5

## 2016-07-20 NOTE — ED Triage Notes (Signed)
Pt was at [place] when her foot slipped and she hit another chair and her toenail was ripped back.

## 2016-07-20 NOTE — ED Provider Notes (Signed)
[facility]    CSN: [id number] Arrival date & time: 07/20/16  1358     History   Chief Complaint Chief Complaint  Patient presents with  . Toe Injury    left great    HPI Tina Holmes is a 46 y.o. female.   Pt was at [place] when her foot slipped and she hit another chair and her toenail was ripped back. She had a fair amount of bleeding but the bleeding has stopped. She does not have significant pain now      Past Medical History:  Diagnosis Date  . Anxiety   . Depression   . GERD (gastroesophageal reflux disease)   . IBS (irritable bowel syndrome)   . Migraines   . Restless leg syndrome     Patient Active Problem List   Diagnosis Date Noted  . Prediabetes 08/14/2015  . Medication management 08/14/2014  . Vitamin D deficiency 08/10/2013  . IBS (irritable bowel syndrome)   . Migraines   . GERD (gastroesophageal reflux disease)     Past Surgical History:  Procedure Laterality Date  . CHOLECYSTECTOMY    . TERMINATION OF PREGNANC     18 WEEKS--DOWNS SYNDROME    OB History    Gravida Para Term Preterm AB Living   2 1     1 1    SAB TAB Ectopic Multiple Live Births           1       Home Medications    Prior to Admission medications   Medication Sig Start Date End Date Taking? Authorizing Provider  aspirin EC 81 MG tablet Take 81 mg by mouth daily.   Yes Historical Provider, MD  Cholecalciferol (VITAMIN D PO) Take 6,000 Units by mouth.     Yes Historical Provider, MD  colestipol (COLESTID) 1 g tablet TAKE 1 TABLET BY MOUTH TWICE A DAY 04/20/16  Yes Milus Banister, MD  Norgestimate-Ethinyl Estradiol Triphasic (TRI-PREVIFEM) 0.18/0.215/0.25 MG-35 MCG tablet Take 1 tablet by mouth daily. 05/25/16  Yes Anastasio Auerbach, MD  pantoprazole (PROTONIX) 40 MG tablet TAKE 1 TABLET BY MOUTH EVERY DAY 06/29/16  Yes Milus Banister, MD  ranitidine (ZANTAC) 150 MG tablet Take 1 tablet (150 mg total) by mouth at bedtime. 05/29/15  Yes Lori P Hvozdovic, PA-C     Family History Family History  Problem Relation Age of Onset  . Diabetes Mother   . Hypertension Mother   . Heart attack Mother   . Hypertension Father   . CVA Maternal Grandfather     Social History Social History  Substance Use Topics  . Smoking status: Former Smoker    Quit date: 03/30/1996  . Smokeless tobacco: Never Used  . Alcohol use 0.0 oz/week     Comment: OCC     Allergies   Imitrex [sumatriptan]; Celexa [citalopram]; Codeine; Viberzi [eluxadoline]; and Zoloft [sertraline hcl]   Review of Systems Review of Systems  Skin: Positive for wound.  All other systems reviewed and are negative.    Physical Exam Triage Vital Signs ED Triage Vitals  Enc Vitals Group     BP 07/20/16 1434 (!) 91/53     Pulse Rate 07/20/16 1434 85     Resp --      Temp 07/20/16 1434 (!) 100.5 F (38.1 C)     Temp Source 07/20/16 1434 Oral     SpO2 07/20/16 1434 98 %     Weight --      Height --  Head Circumference --      Peak Flow --      Pain Score 07/20/16 1432 6     Pain Loc --      Pain Edu? --      Excl. in [place]? --    No data found.   Updated Vital Signs BP (!) 91/53 (BP Location: Left Arm)   Pulse 85   Temp (!) 100.5 F (38.1 C) (Oral)   LMP 06/22/2016 (Approximate)   SpO2 98%   Physical Exam  Constitutional: She is oriented to person, place, and time. She appears well-developed and well-nourished.  HENT:  Right Ear: External ear normal.  Left Ear: External ear normal.  Mouth/Throat: Oropharynx is clear and moist.  Eyes: Conjunctivae and EOM are normal. Pupils are equal, round, and reactive to light.  Neck: Normal range of motion. Neck supple.  Pulmonary/Chest: Effort normal.  Musculoskeletal: Normal range of motion. She exhibits tenderness.  Patient has a laceration where the end of the nail meets the tip of the left distal great toe.  Neurological: She is alert and oriented to person, place, and time.  Skin: Skin is warm and dry.  Nursing note  and vitals reviewed.    UC Treatments / Results  Labs (all labs ordered are listed, but only abnormal results are displayed) Labs Reviewed - No data to display  EKG  EKG Interpretation None       Radiology No results found.  Procedures Procedures (including critical care time)  Medications Ordered in UC Medications - No data to display   Initial Impression / Assessment and Plan / UC Course  I have reviewed the triage vital signs and the nursing notes.  Pertinent labs & imaging results that were available during my care of the patient were reviewed by me and considered in my medical decision making (see chart for details).   Dermabond was applied to protect the distal toe laceration  Final Clinical Impressions(s) / UC Diagnoses   Final diagnoses:  Laceration of left great toe without damage to nail, foreign body presence unspecified, initial encounter    New Prescriptions New Prescriptions   No medications on file     Robyn Haber, MD 07/20/16 1519

## 2016-08-25 ENCOUNTER — Encounter: Payer: Self-pay | Admitting: Gynecology

## 2016-08-25 ENCOUNTER — Ambulatory Visit (INDEPENDENT_AMBULATORY_CARE_PROVIDER_SITE_OTHER): Payer: 59 | Admitting: Gynecology

## 2016-08-25 ENCOUNTER — Telehealth: Payer: Self-pay | Admitting: *Deleted

## 2016-08-25 VITALS — BP 118/76

## 2016-08-25 DIAGNOSIS — N63 Unspecified lump in unspecified breast: Secondary | ICD-10-CM

## 2016-08-25 NOTE — Telephone Encounter (Signed)
-----   Message from Anastasio Auerbach, MD sent at 08/25/2016 12:37 PM EDT ----- Call and schedule a diagnostic mammography and ultrasound reference new onset small breast mass 12:00 edge of areola. Attempted aspiration negative

## 2016-08-25 NOTE — Progress Notes (Signed)
    Tina Holmes January 13, 1971 [id number]        46 y.o.  G2P0011 presents having felt a small mass in her right breast this past week. Had an itch and when she went to scratch filled this area. Is not uncomfortable. No nipple discharge. Most recent mammography 02/2016 negative.  Past medical history,surgical history, problem list, medications, allergies, family history and social history were all reviewed and documented in the EPIC chart.  Directed ROS with pertinent positives and negatives documented in the history of present illness/assessment and plan.  Exam: Caryn Bee assistant Vitals:   08/25/16 1217  BP: 118/76   General appearance:  Normal Both breast examined lying and sitting. Left without masses, retractions, discharge, adenopathy. Right with small density 12:00 position at the edge of areola. No overlying skin changes, nipple discharge or axillary adenopathy.  Procedure: Skin overlying this area was cleansed with alcohol, infiltrated with 1% lidocaine and after several passes no return was noted.  Assessment/Plan:  46 y.o. G2P0011 small area of density 12:00 position right breast at the edge of the areola. Attempted aspiration negative. Recommend diagnostic mammography and ultrasound of this area. Patient knows to expect a call from the breast center within the next week or 2. If she does not hear from them and she will call us in follow up. Differential reviewed with the patient.      Anastasio Auerbach MD, 12:38 PM 08/25/2016

## 2016-08-25 NOTE — Telephone Encounter (Signed)
Appointment schedule on 08/27/16 @ 2:20pm left message for pt to call.

## 2016-08-25 NOTE — Telephone Encounter (Signed)
Pt informed

## 2016-08-25 NOTE — Patient Instructions (Signed)
Breast center will call to schedule a mammogram and ultrasound

## 2016-08-25 NOTE — Telephone Encounter (Signed)
Pt called c/o new found breast lump, I advised pt to schedule OV with provider. Transferred to appointment desk to schedule.

## 2016-08-27 ENCOUNTER — Ambulatory Visit
Admission: RE | Admit: 2016-08-27 | Discharge: 2016-08-27 | Disposition: A | Discharge status: Home / Self Care | Payer: 59 | Source: Ambulatory Visit | Attending: Gynecology | Admitting: Gynecology

## 2016-08-27 DIAGNOSIS — N6310 Unspecified lump in the right breast, unspecified quadrant: Secondary | ICD-10-CM | POA: Diagnosis not present

## 2016-08-27 DIAGNOSIS — R922 Inconclusive mammogram: Secondary | ICD-10-CM | POA: Diagnosis not present

## 2016-08-27 DIAGNOSIS — N63 Unspecified lump in unspecified breast: Secondary | ICD-10-CM

## 2016-09-08 IMAGING — RF DG ESOPHAGUS
19 of 24 series · 19 of 24 positions shown · non-contrast
Comparison: None.

CLINICAL DATA: Dysphagia, globus sensation

EXAM:
ESOPHOGRAM / BARIUM SWALLOW / BARIUM TABLET STUDY
TECHNIQUE: Combined double contrast and single contrast examination performed
using effervescent crystals, thick barium liquid, and thin barium
liquid. The patient was observed with fluoroscopy swallowing a 13 mm
barium sulphate tablet.
FLUOROSCOPY TIME:  Radiation Exposure Index (as provided by the
fluoroscopic device): 16 d Gy cm2
If the device does not provide the exposure index:
Fluoroscopy Time:  dictate in minutes and seconds
Number of Acquired Images:

[Series 1: run · 1 of 6 slices shown (1 of 19)]
[im 1/6]
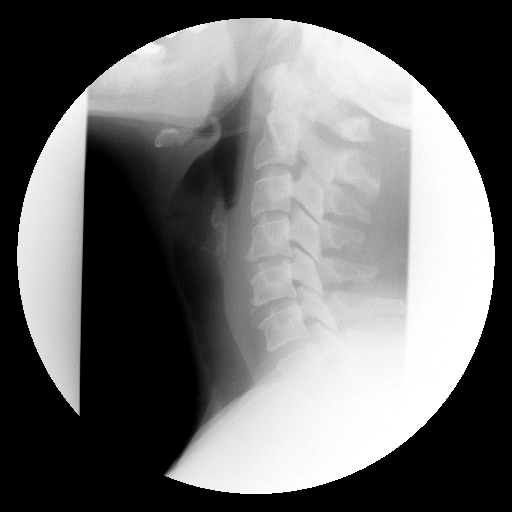

[Series 2: run · 1 of 1 slices shown (2 of 19)]
[im 1/1]
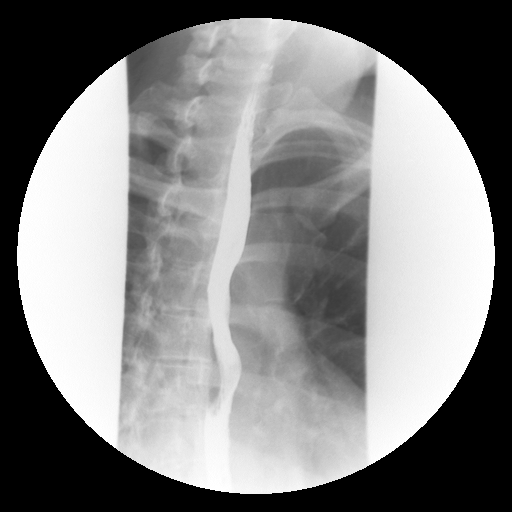

[Series 4: run · 1 of 1 slices shown (3 of 19)]
[im 1/1]
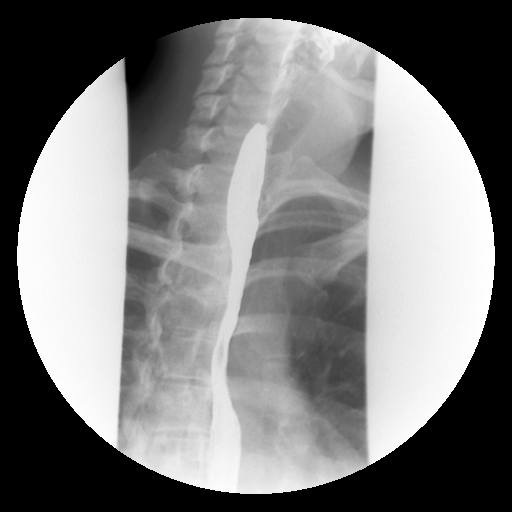

[Series 5: run · 1 of 1 slices shown (4 of 19)]
[im 1/1]
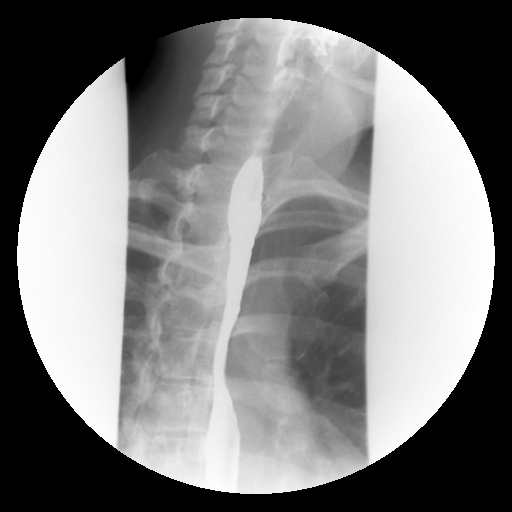

[Series 6: run · 1 of 1 slices shown (5 of 19)]
[im 1/1]
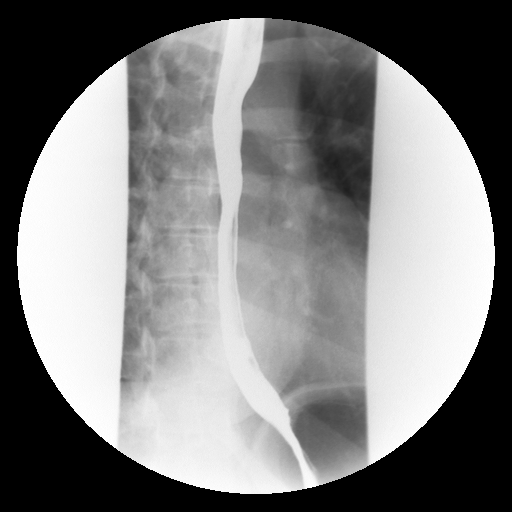

[Series 7: run · 1 of 1 slices shown (6 of 19)]
[im 1/1]
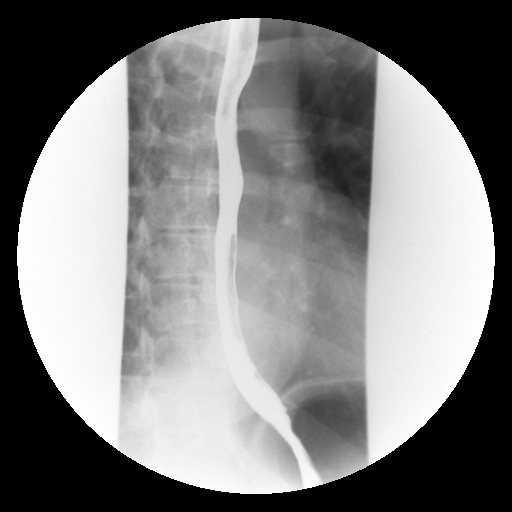

[Series 9: run · 1 of 1 slices shown (7 of 19)]
[im 1/1]
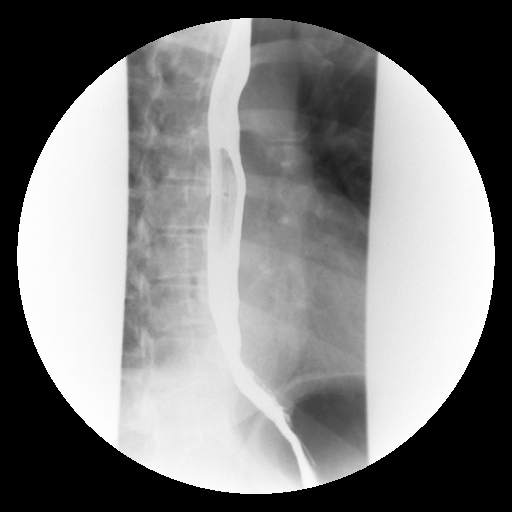

[Series 10: run · 1 of 1 slices shown (8 of 19)]
[im 1/1]
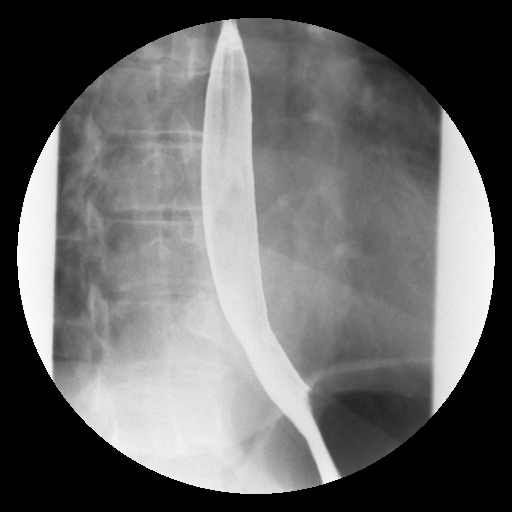

[Series 11: run · 1 of 1 slices shown (9 of 19)]
[im 1/1]
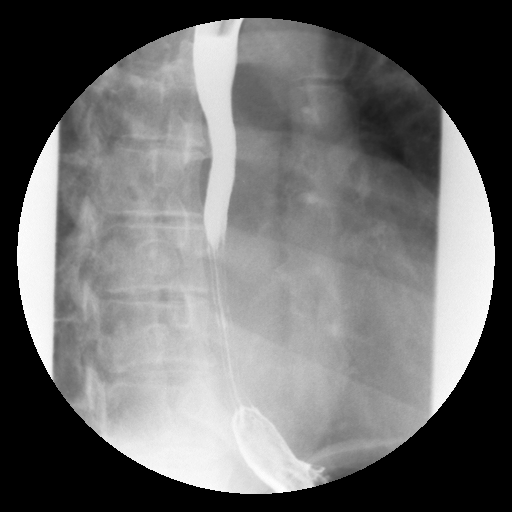

[Series 13: run · 1 of 1 slices shown (10 of 19)]
[im 1/1]
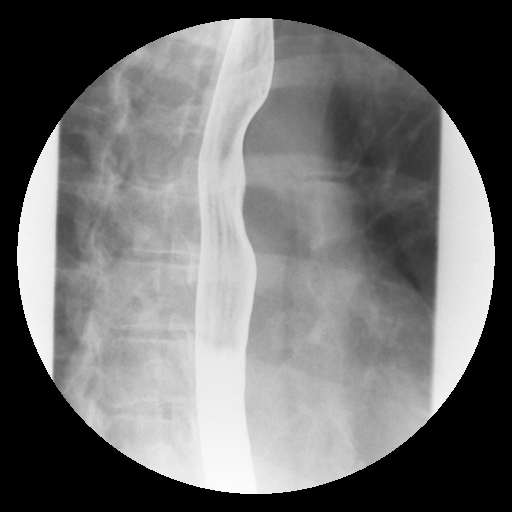

[Series 14: run · 1 of 1 slices shown (11 of 19)]
[im 1/1]
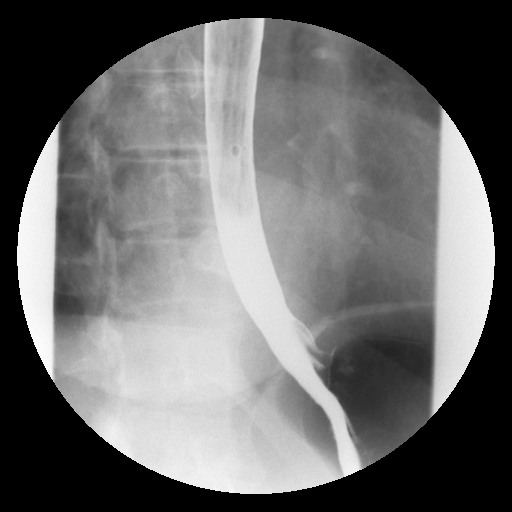

[Series 15: run · 1 of 1 slices shown (12 of 19)]
[im 1/1]
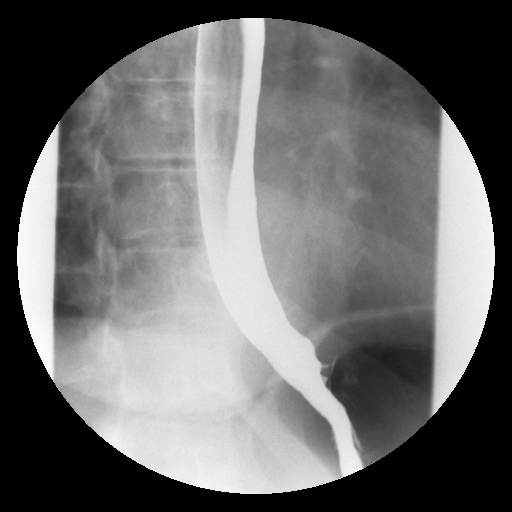

[Series 16: run · 1 of 1 slices shown (13 of 19)]
[im 1/1]
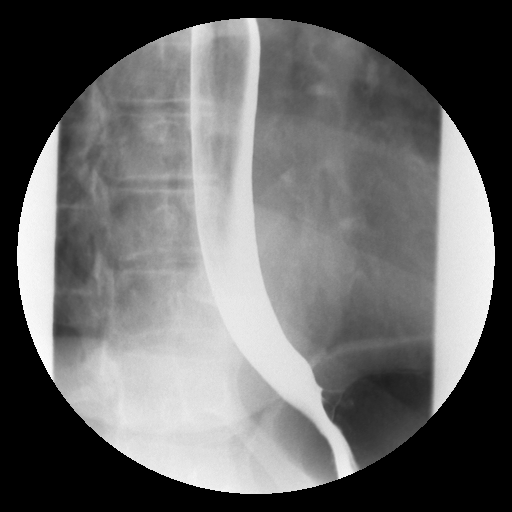

[Series 18: run · 1 of 1 slices shown (14 of 19)]
[im 1/1]
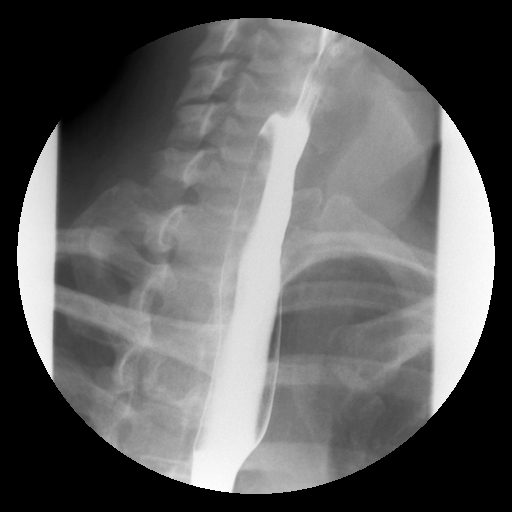

[Series 19: run · 1 of 1 slices shown (15 of 19)]
[im 1/1]
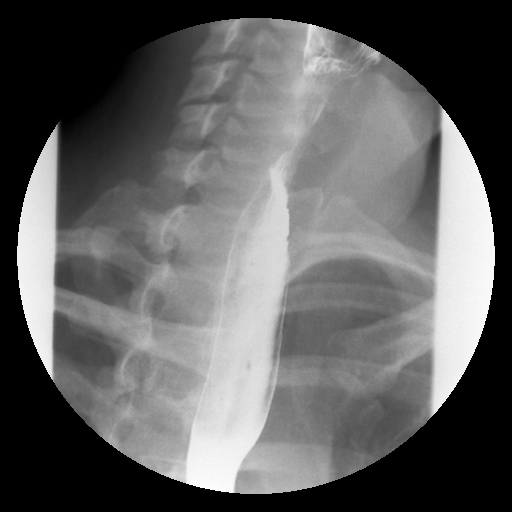

[Series 20: run · 1 of 1 slices shown (16 of 19)]
[im 1/1]
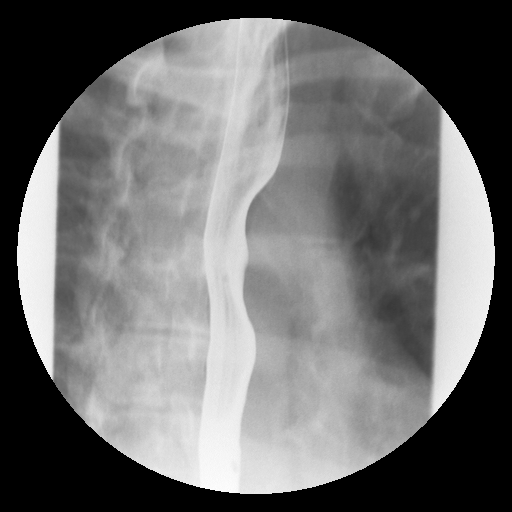

[Series 21: run · 1 of 1 slices shown (17 of 19)]
[im 1/1]
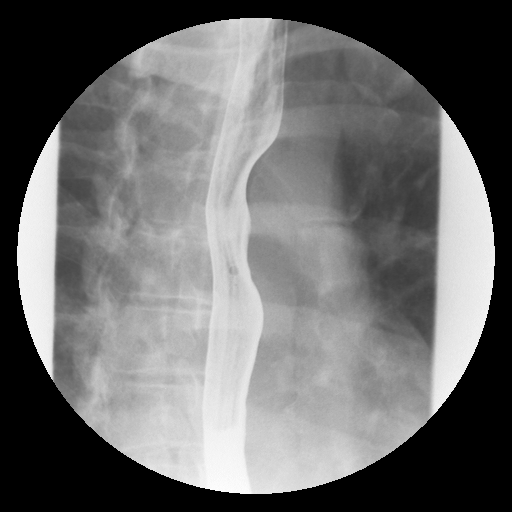

[Series 23: run · 1 of 1 slices shown (18 of 19)]
[im 1/1]
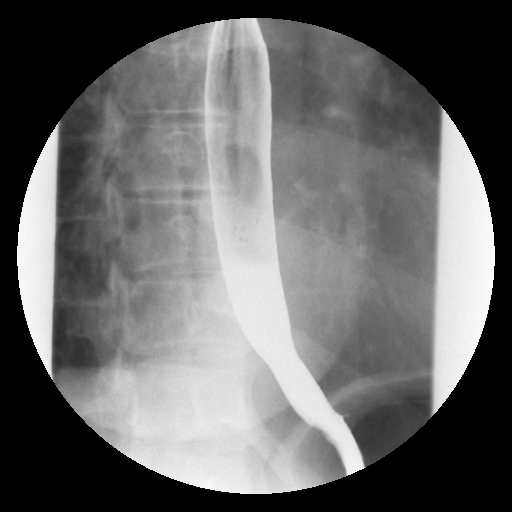

[Series 24: run · 1 of 1 slices shown (19 of 19)]
[im 1/1]
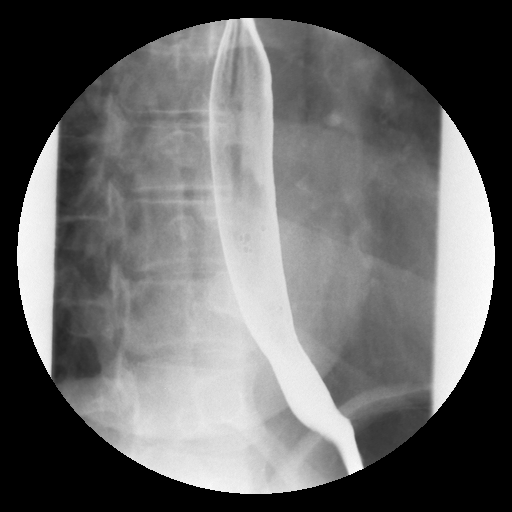

[19 of 24 positions shown; findings below may reference images not displayed]

FINDINGS: Fluoroscopic evaluation of swallowing demonstrates normal cervical
esophagus. There is disruption [DATE] primary esophageal
peristaltic waves. No fixed stricture, fold thickening or mass. No
reflux with the water siphon maneuver. The patient swallowed a 13 mm
barium tablet which freely passed into the stomach.
IMPRESSION: Nonspecific esophageal motility disorder. Otherwise unremarkable
study.

## 2016-10-04 ENCOUNTER — Encounter: Payer: Self-pay | Admitting: Internal Medicine

## 2016-10-04 NOTE — Progress Notes (Signed)
[place] [facility] Unk Pinto, M.D.      Uvaldo Bristle. Silverio Lay, P.A.-C [facility]                [address] [place], [place] [address] Telephone [phone] Telefax [phone]  Annual Screening/Preventative Visit & Comprehensive Evaluation &  Examination     This very nice 46 y.o. MWF presents for a Screening/Preventative Visit & comprehensive evaluation.  Patient has been followed proactively and screened expectantly  for HTN, Prediabetes, Hyperlipidemia and Vitamin D Deficiency. Patient has GERD controlled on Meds and also is on Colestid for her IBS.       Patient's BP has been controlled at home and patient denies any cardiac symptoms as chest pain, palpitations, shortness of breath, dizziness or ankle swelling. Today's BP: 110/76      Patient's hyperlipidemia is controlled with diet. Last lipids were at goal: Lab Results  Component Value Date   CHOL 150 08/14/2015   HDL 66 08/14/2015   LDLCALC 66 08/14/2015   TRIG 90 08/14/2015   CHOLHDL 2.3 08/14/2015      Patient has prediabetes (A1c 5.8% in 2012) and patient denies reactive hypoglycemic symptoms, visual blurring, diabetic polys, or paresthesias. Last A1c was still not at goal: Lab Results  Component Value Date   HGBA1C 5.7 (H) 08/14/2015      Finally, patient has history of Vitamin D Deficiency ("40" on treatment in 2009) and last Vitamin D was still low (goal 70-100): Lab Results  Component Value Date   VD25OH 47 08/14/2015   Current Outpatient Prescriptions on File Prior to Visit  Medication Sig  . aspirin EC 81 MG tablet Take 81 mg by mouth daily.  . Cholecalciferol (VITAMIN D PO) Take 6,000 Units by mouth.    . colestipol (COLESTID) 1 g tablet TAKE 1 TABLET BY MOUTH TWICE A DAY  . Norgestimate-Ethinyl Estradiol Triphasic (TRI-PREVIFEM) 0.18/0.215/0.25 MG-35 MCG tablet Take 1 tablet by mouth daily.  . pantoprazole (PROTONIX) 40 MG  tablet TAKE 1 TABLET BY MOUTH EVERY DAY  . ranitidine (ZANTAC) 150 MG tablet Take 1 tablet (150 mg total) by mouth at bedtime.   No current facility-administered medications on file prior to visit.    Allergies  Allergen Reactions  . Imitrex [Sumatriptan]     Throat swelling  . Viberzi [Eluxadoline]     Throat swelling   . Celexa [Citalopram]     Decreased libido  . Codeine     N/V  . Zoloft [Sertraline Hcl]     N/V   Past Medical History:  Diagnosis Date  . Anxiety   . Depression   . GERD (gastroesophageal reflux disease)   . IBS (irritable bowel syndrome)   . Migraines   . Restless leg syndrome    Health Maintenance  Topic Date Due  . INFLUENZA VACCINE  10/28/2016  . PAP SMEAR  05/26/2019  . TETANUS/TDAP  07/21/2026  . HIV Screening  Completed   Immunization History  Administered Date(s) Administered  . Influenza Split 01/14/2011, 02/16/2013, 01/10/2015  . Influenza-Unspecified 02/11/2014  . PPD Test 08/10/2013, 08/14/2014, 08/15/2015  . Pneumococcal-Unspecified 07/29/2010  . Td 03/31/2003  . Tdap 08/14/2014, 07/20/2016   Past Surgical History:  Procedure Laterality Date  . CHOLECYSTECTOMY    . TERMINATION OF PREGNANC     18 WEEKS--DOWNS SYNDROME   Family History  Problem Relation Age of Onset  .  Diabetes Mother   . Hypertension Mother   . Heart attack Mother   . Hypertension Father   . CVA Maternal Grandfather    Social History  Substance Use Topics  . Smoking status: Former Smoker    Quit date: 03/30/1996  . Smokeless tobacco: Never Used  . Alcohol use 0.0 oz/week     Comment: OCC    ROS Constitutional: Denies fever, chills, weight loss/gain, headaches, insomnia,  night sweats, and change in appetite. Does c/o fatigue. Eyes: Denies redness, blurred vision, diplopia, discharge, itchy, watery eyes.  ENT: Denies discharge, congestion, post nasal drip, epistaxis, sore throat, earache, hearing loss, dental pain, Tinnitus, Vertigo, Sinus pain, snoring.   Cardio: Denies chest pain, palpitations, irregular heartbeat, syncope, dyspnea, diaphoresis, orthopnea, PND, claudication, edema Respiratory: denies cough, dyspnea, DOE, pleurisy, hoarseness, laryngitis, wheezing.  Gastrointestinal: Denies dysphagia, heartburn, reflux, water brash, pain, cramps, nausea, vomiting, bloating, diarrhea, constipation, hematemesis, melena, hematochezia, jaundice, hemorrhoids Genitourinary: Denies dysuria, frequency, urgency, nocturia, hesitancy, discharge, hematuria, flank pain Breast: Breast lumps, nipple discharge, bleeding.  Musculoskeletal: Denies arthralgia, myalgia, stiffness, Jt. Swelling, pain, limp, and strain/sprain. Denies falls. Skin: Denies puritis, rash, hives, warts, acne, eczema, changing in skin lesion Neuro: No weakness, tremor, incoordination, spasms, paresthesia, pain Psychiatric: Denies confusion, memory loss, sensory loss. Denies Depression. Endocrine: Denies change in weight, skin, hair change, nocturia, and paresthesia, diabetic polys, visual blurring, hyper / hypo glycemic episodes.  Heme/Lymph: No excessive bleeding, bruising, enlarged lymph nodes.  Physical Exam  BP 110/76   Pulse 72   Temp (!) 97.3 F (36.3 C)   Resp 16   Ht 5\' 3"  (1.6 m)   Wt 130 lb 3.2 oz (59.1 kg)   BMI 23.06 kg/m   General Appearance: Well nourished, well groomed and in no apparent distress.  Eyes: PERRLA, EOMs, conjunctiva no swelling or erythema, normal fundi and vessels. Sinuses: No frontal/maxillary tenderness ENT/Mouth: EACs patent / TMs  nl. Nares clear without erythema, swelling, mucoid exudates. Oral hygiene is good. No erythema, swelling, or exudate. Tongue normal, non-obstructing. Tonsils not swollen or erythematous. Hearing normal.  Neck: Supple, thyroid normal. No bruits, nodes or JVD. Respiratory: Respiratory effort normal.  BS equal and clear bilateral without rales, rhonci, wheezing or stridor. Cardio: Heart sounds are normal with regular rate  and rhythm and no murmurs, rubs or gallops. Peripheral pulses are normal and equal bilaterally without edema. No aortic or femoral bruits. Chest: symmetric with normal excursions and percussion. Breasts: Symmetric, without lumps, nipple discharge, retractions, or fibrocystic changes.  Abdomen: Flat, soft with bowel sounds active. Nontender, no guarding, rebound, hernias, masses, or organomegaly.  Lymphatics: Non tender without lymphadenopathy.  Genitourinary:  Musculoskeletal: Full ROM all peripheral extremities, joint stability, 5/5 strength, and normal gait. Skin: Warm and dry without rashes, lesions, cyanosis, clubbing or  ecchymosis.  Neuro: Cranial nerves intact, reflexes equal bilaterally. Normal muscle tone, no cerebellar symptoms. Sensation intact.  Pysch: Alert and oriented X 3, normal affect, Insight and Judgment appropriate.   Assessment and Plan  1. Annual Preventative Screening Examination  2. Hypertension screen  - EKG 12-Lead - [place], RETROPERITNL ABD,  LTD - Urinalysis, Routine w reflex microscopic - Microalbumin / creatinine urine ratio - CBC with Differential/Platelet - BASIC METABOLIC PANEL WITH GFR - Magnesium - TSH  3. Elevated lipids  - EKG 12-Lead - [place], RETROPERITNL ABD,  LTD - Hepatic function panel - Lipid panel - TSH  4. Prediabetes  - EKG 12-Lead - [place], RETROPERITNL ABD,  LTD - Hemoglobin A1c -  Insulin, random  5. Vitamin D deficiency  - VITAMIN D 25 Hydroxy (  6. Gastroesophageal reflux disease   7. Irritable bowel syndrome with diarrhea   8. Screening for rectal cancer  - POC Hemoccult Bld/Stl   9. Screening for ischemic heart disease  - EKG 12-Lead  10. Screening examination for pulmonary tuberculosis  - [place], RETROPERITNL ABD,  LTD  11. Fatigue  - Vitamin B12 - Iron and TIBC - CBC with Differential/Platelet  12. Medication management  - Urinalysis, Routine w reflex microscopic - Microalbumin / creatinine urine ratio -  CBC with Differential/Platelet - BASIC METABOLIC PANEL WITH GFR - Hepatic function panel - Magnesium - Lipid panel - TSH - Hemoglobin A1c - Insulin, random - VITAMIN D 25 Hydroxy   13. Migraine with aura   - propranolol (INDERAL) 40 MG tablet; Take 1 tablet 3 to 4 x / day for Migraine  Dispense: 30 tablet; Refill: 2        Patient was counseled in prudent diet to achieve/maintain BMI less than 25 for weight control, BP monitoring, regular exercise and medications. Discussed med's effects and SE's. Screening labs and tests as requested with regular follow-up as recommended. Over 40 minutes of exam, counseling, chart review and high complex critical decision making was performed.

## 2016-10-04 NOTE — Patient Instructions (Signed)
Preventive Care for Adults  A healthy lifestyle and preventive care can promote health and wellness. Preventive health guidelines for women include the following key practices.  A routine yearly physical is a good way to check with your health care provider about your health and preventive screening. It is a chance to share any concerns and updates on your health and to receive a thorough exam.  Visit your dentist for a routine exam and preventive care every 6 months. Brush your teeth twice a day and floss once a day. Good oral hygiene prevents tooth decay and gum disease.  The frequency of eye exams is based on your age, health, family medical history, use of contact lenses, and other factors. Follow your health care provider's recommendations for frequency of eye exams.  Eat a healthy diet. Foods like vegetables, fruits, whole grains, low-fat dairy products, and lean protein foods contain the nutrients you need without too many calories. Decrease your intake of foods high in solid fats, added sugars, and salt. Eat the right amount of calories for you.Get information about a proper diet from your health care provider, if necessary.  Regular physical exercise is one of the most important things you can do for your health. Most adults should get at least 150 minutes of moderate-intensity exercise (any activity that increases your heart rate and causes you to sweat) each week. In addition, most adults need muscle-strengthening exercises on 2 or more days a week.  Maintain a healthy weight. The body mass index (BMI) is a screening tool to identify possible weight problems. It provides an estimate of body fat based on height and weight. Your health care provider can find your BMI and can help you achieve or maintain a healthy weight.For adults 20 years and older:  A BMI below 18.5 is considered underweight.  A BMI of 18.5 to 24.9 is normal.  A BMI of 25 to 29.9 is considered overweight.  A BMI of  30 and above is considered obese.  Maintain normal blood lipids and cholesterol levels by exercising and minimizing your intake of saturated fat. Eat a balanced diet with plenty of fruit and vegetables. Blood tests for lipids and cholesterol should begin at age 51 and be repeated every 5 years. If your lipid or cholesterol levels are high, you are over 50, or you are at high risk for heart disease, you may need your cholesterol levels checked more frequently.Ongoing high lipid and cholesterol levels should be treated with medicines if diet and exercise are not working.  If you smoke, find out from your health care provider how to quit. If you do not use tobacco, do not start.  Lung cancer screening is recommended for adults aged 33-80 years who are at high risk for developing lung cancer because of a history of smoking. A yearly low-dose CT scan of the lungs is recommended for people who have at least a 30-pack-year history of smoking and are a current smoker or have quit within the past 15 years. A pack year of smoking is smoking an average of 1 pack of cigarettes a day for 1 year (for example: 1 pack a day for 30 years or 2 packs a day for 15 years). Yearly screening should continue until the smoker has stopped smoking for at least 15 years. Yearly screening should be stopped for people who develop a health problem that would prevent them from having lung cancer treatment.  High blood pressure causes heart disease and increases the risk of  stroke. Your blood pressure should be checked at least every 1 to 2 years. Ongoing high blood pressure should be treated with medicines if weight loss and exercise do not work.  If you are 22-61 years old, ask your health care provider if you should take aspirin to prevent strokes.  Diabetes screening involves taking a blood sample to check your fasting blood sugar level. This should be done once every 3 years, after age 5, if you are within normal weight and  without risk factors for diabetes. Testing should be considered at a younger age or be carried out more frequently if you are overweight and have at least 1 risk factor for diabetes.  Breast cancer screening is essential preventive care for women. You should practice "breast self-awareness." This means understanding the normal appearance and feel of your breasts and may include breast self-examination. Any changes detected, no matter how small, should be reported to a health care provider. Women in their 14s and 30s should have a clinical breast exam (CBE) by a health care provider as part of a regular health exam every 1 to 3 years. After age 74, women should have a CBE every year. Starting at age 57, women should consider having a mammogram (breast X-ray test) every year. Women who have a family history of breast cancer should talk to their health care provider about genetic screening. Women at a high risk of breast cancer should talk to their health care providers about having an MRI and a mammogram every year.  Breast cancer gene (BRCA)-related cancer risk assessment is recommended for women who have family members with BRCA-related cancers. BRCA-related cancers include breast, ovarian, tubal, and peritoneal cancers. Having family members with these cancers may be associated with an increased risk for harmful changes (mutations) in the breast cancer genes BRCA1 and BRCA2. Results of the assessment will determine the need for genetic counseling and BRCA1 and BRCA2 testing.  Routine pelvic exams to screen for cancer are no longer recommended for nonpregnant women who are considered low risk for cancer of the pelvic organs (ovaries, uterus, and vagina) and who do not have symptoms. Ask your health care provider if a screening pelvic exam is right for you.  If you have had past treatment for cervical cancer or a condition that could lead to cancer, you need Pap tests and screening for cancer for at least 20  years after your treatment. If Pap tests have been discontinued, your risk factors (such as having a new sexual partner) need to be reassessed to determine if screening should be resumed. Some women have medical problems that increase the chance of getting cervical cancer. In these cases, your health care provider may recommend more frequent screening and Pap tests.  Colorectal cancer can be detected and often prevented. Most routine colorectal cancer screening begins at the age of 50 years and continues through age 43 years. However, your health care provider may recommend screening at an earlier age if you have risk factors for colon cancer. On a yearly basis, your health care provider may provide home test kits to check for hidden blood in the stool. Use of a small camera at the end of a tube, to directly examine the colon (sigmoidoscopy or colonoscopy), can detect the earliest forms of colorectal cancer. Talk to your health care provider about this at age 6, when routine screening begins. Direct exam of the colon should be repeated every 5-10 years through age 62 years, unless early forms of pre-cancerous  polyps or small growths are found.  Hepatitis C blood testing is recommended for all people born from [birth year] through 1965 and any individual with known risks for hepatitis C.  Pra  Osteoporosis is a disease in which the bones lose minerals and strength with aging. This can result in serious bone fractures or breaks. The risk of osteoporosis can be identified using a bone density scan. Women ages 38 years and over and women at risk for fractures or osteoporosis should discuss screening with their health care providers. Ask your health care provider whether you should take a calcium supplement or vitamin D to reduce the rate of osteoporosis.  Menopause can be associated with physical symptoms and risks. Hormone replacement therapy is available to decrease symptoms and risks. You should talk to your  health care provider about whether hormone replacement therapy is right for you.  Use sunscreen. Apply sunscreen liberally and repeatedly throughout the day. You should seek shade when your shadow is shorter than you. Protect yourself by wearing long sleeves, pants, a wide-brimmed hat, and sunglasses year round, whenever you are outdoors.  Once a month, do a whole body skin exam, using a mirror to look at the skin on your back. Tell your health care provider of new moles, moles that have irregular borders, moles that are larger than a pencil eraser, or moles that have changed in shape or color.  Stay current with required vaccines (immunizations).  Influenza vaccine. All adults should be immunized every year.  Tetanus, diphtheria, and acellular pertussis (Td, Tdap) vaccine. Pregnant women should receive 1 dose of Tdap vaccine during each pregnancy. The dose should be obtained regardless of the length of time since the last dose. Immunization is preferred during the 27th-36th week of gestation. An adult who has not previously received Tdap or who does not know her vaccine status should receive 1 dose of Tdap. This initial dose should be followed by tetanus and diphtheria toxoids (Td) booster doses every 10 years. Adults with an unknown or incomplete history of completing a 3-dose immunization series with Td-containing vaccines should begin or complete a primary immunization series including a Tdap dose. Adults should receive a Td booster every 10 years.  Varicella vaccine. An adult without evidence of immunity to varicella should receive 2 doses or a second dose if she has previously received 1 dose. Pregnant females who do not have evidence of immunity should receive the first dose after pregnancy. This first dose should be obtained before leaving the health care facility. The second dose should be obtained 4-8 weeks after the first dose.  Human papillomavirus (HPV) vaccine. Females aged 13-26 years  who have not received the vaccine previously should obtain the 3-dose series. The vaccine is not recommended for use in pregnant females. However, pregnancy testing is not needed before receiving a dose. If a female is found to be pregnant after receiving a dose, no treatment is needed. In that case, the remaining doses should be delayed until after the pregnancy. Immunization is recommended for any person with an immunocompromised condition through the age of 72 years if she did not get any or all doses earlier. During the 3-dose series, the second dose should be obtained 4-8 weeks after the first dose. The third dose should be obtained 24 weeks after the first dose and 16 weeks after the second dose.  Zoster vaccine. One dose is recommended for adults aged 70 years or older unless certain conditions are present.  Measles, mumps, and rubella (  MMR) vaccine. Adults born before [birth year] generally are considered immune to measles and mumps. Adults born in [birth year] or later should have 1 or more doses of MMR vaccine unless there is a contraindication to the vaccine or there is laboratory evidence of immunity to each of the three diseases. A routine second dose of MMR vaccine should be obtained at least 28 days after the first dose for students attending postsecondary schools, health care workers, or international travelers. People who received inactivated measles vaccine or an unknown type of measles vaccine during 1963-1967 should receive 2 doses of MMR vaccine. People who received inactivated mumps vaccine or an unknown type of mumps vaccine before 1979 and are at high risk for mumps infection should consider immunization with 2 doses of MMR vaccine. For females of childbearing age, rubella immunity should be determined. If there is no evidence of immunity, females who are not pregnant should be vaccinated. If there is no evidence of immunity, females who are pregnant should delay immunization until after pregnancy.  Unvaccinated health care workers born before [birth year] who lack laboratory evidence of measles, mumps, or rubella immunity or laboratory confirmation of disease should consider measles and mumps immunization with 2 doses of MMR vaccine or rubella immunization with 1 dose of MMR vaccine.  Pneumococcal 13-valent conjugate (PCV13) vaccine. When indicated, a person who is uncertain of her immunization history and has no record of immunization should receive the PCV13 vaccine. An adult aged 64 years or older who has certain medical conditions and has not been previously immunized should receive 1 dose of PCV13 vaccine. This PCV13 should be followed with a dose of pneumococcal polysaccharide (PPSV23) vaccine. The PPSV23 vaccine dose should be obtained at least 8 weeks after the dose of PCV13 vaccine. An adult aged 37 years or older who has certain medical conditions and previously received 1 or more doses of PPSV23 vaccine should receive 1 dose of PCV13. The PCV13 vaccine dose should be obtained 1 or more years after the last PPSV23 vaccine dose.    Pneumococcal polysaccharide (PPSV23) vaccine. When PCV13 is also indicated, PCV13 should be obtained first. All adults aged 30 years and older should be immunized. An adult younger than age 40 years who has certain medical conditions should be immunized. Any person who resides in a nursing home or long-term care facility should be immunized. An adult smoker should be immunized. People with an immunocompromised condition and certain other conditions should receive both PCV13 and PPSV23 vaccines. People with human immunodeficiency virus (HIV) infection should be immunized as soon as possible after diagnosis. Immunization during chemotherapy or radiation therapy should be avoided. Routine use of PPSV23 vaccine is not recommended for American Indians, [place] Natives, or people younger than 65 years unless there are medical conditions that require PPSV23 vaccine. When indicated,  people who have unknown immunization and have no record of immunization should receive PPSV23 vaccine. One-time revaccination 5 years after the first dose of PPSV23 is recommended for people aged 19-64 years who have chronic kidney failure, nephrotic syndrome, asplenia, or immunocompromised conditions. People who received 1-2 doses of PPSV23 before age 37 years should receive another dose of PPSV23 vaccine at age 67 years or later if at least 5 years have passed since the previous dose. Doses of PPSV23 are not needed for people immunized with PPSV23 at or after age 7 years.  Preventive Services / Frequency   Ages 54 to 14 years  Blood pressure check.  Lipid and cholesterol check.  Lung  cancer screening. / Every year if you are aged 64-80 years and have a 30-pack-year history of smoking and currently smoke or have quit within the past 15 years. Yearly screening is stopped once you have quit smoking for at least 15 years or develop a health problem that would prevent you from having lung cancer treatment.  Clinical breast exam.** / Every year after age 36 years.  BRCA-related cancer risk assessment.** / For women who have family members with a BRCA-related cancer (breast, ovarian, tubal, or peritoneal cancers).  Mammogram.** / Every year beginning at age 109 years and continuing for as long as you are in good health. Consult with your health care provider.  Pap test.** / Every 3 years starting at age 39 years through age 65 or 28 years with a history of 3 consecutive normal Pap tests.  HPV screening.** / Every 3 years from ages 91 years through ages 32 to 78 years with a history of 3 consecutive normal Pap tests.  Fecal occult blood test (FOBT) of stool. / Every year beginning at age 26 years and continuing until age 12 years. You may not need to do this test if you get a colonoscopy every 10 years.  Flexible sigmoidoscopy or colonoscopy.** / Every 5 years for a flexible sigmoidoscopy or  every 10 years for a colonoscopy beginning at age 34 years and continuing until age 60 years.  Hepatitis C blood test.** / For all people born from [birth year] through 1965 and any individual with known risks for hepatitis C.  Skin self-exam. / Monthly.  Influenza vaccine. / Every year.  Tetanus, diphtheria, and acellular pertussis (Tdap/Td) vaccine.** / Consult your health care provider. Pregnant women should receive 1 dose of Tdap vaccine during each pregnancy. 1 dose of Td every 10 years.  Varicella vaccine.** / Consult your health care provider. Pregnant females who do not have evidence of immunity should receive the first dose after pregnancy.  Zoster vaccine.** / 1 dose for adults aged 11 years or older.  Pneumococcal 13-valent conjugate (PCV13) vaccine.** / Consult your health care provider.  Pneumococcal polysaccharide (PPSV23) vaccine.** / 1 to 2 doses if you smoke cigarettes or if you have certain conditions.  Meningococcal vaccine.** / Consult your health care provider.  Hepatitis A vaccine.** / Consult your health care provider.  Hepatitis B vaccine.** / Consult your health care provider. Screening for abdominal aortic aneurysm (AAA)  by ultrasound is recommended for people over 50 who have history of high blood pressure or who are current or former smokers. ++++++++++++++++++ Recommend Adult Low Dose Aspirin or  coated  Aspirin 81 mg daily  To reduce risk of Colon Cancer 20 %,  Skin Cancer 26 % ,  Melanoma 46%  and  Pancreatic cancer 60% +++++++++++++++++++ Vitamin D goal  is between 70-100.  Please make sure that you are taking your Vitamin D as directed.  It is very important as a natural anti-inflammatory  helping hair, skin, and nails, as well as reducing stroke and heart attack risk.  It helps your bones and helps with mood. It also decreases numerous cancer risks so please take it as directed.  Low Vit D is associated with a 200-300% higher risk for CANCER  and  200-300% higher risk for HEART   ATTACK  &  STROKE.   .....................................Marland Kitchen It is also associated with higher death rate at younger ages,  autoimmune diseases like Rheumatoid arthritis, Lupus, Multiple Sclerosis.    Also many other serious conditions, like depression,  Alzheimer's Dementia, infertility, muscle aches, fatigue, fibromyalgia - just to name a few. ++++++++++++++++++ Recommend the book "The END of DIETING" by Dr Excell Seltzer  & the book "The END of DIABETES " by Dr Excell Seltzer At [email or web address] - get book & Audio CD's    Being diabetic has a  300% increased risk for heart attack, stroke, cancer, and alzheimer- type vascular dementia. It is very important that you work harder with diet by avoiding all foods that are white. Avoid white rice (brown & wild rice is OK), white potatoes (sweetpotatoes in moderation is OK), White bread or wheat bread or anything made out of white flour like bagels, donuts, rolls, buns, biscuits, cakes, pastries, cookies, pizza crust, and pasta (made from white flour & egg whites) - vegetarian pasta or spinach or wheat pasta is OK. Multigrain breads like Arnold's or Pepperidge [employer], or multigrain sandwich thins or flatbreads.  Diet, exercise and weight loss can reverse and cure diabetes in the early stages.  Diet, exercise and weight loss is very important in the control and prevention of complications of diabetes which affects every system in your body, ie. Brain - dementia/stroke, eyes - glaucoma/blindness, heart - heart attack/heart failure, kidneys - dialysis, stomach - gastric paralysis, intestines - malabsorption, nerves - severe painful neuritis, circulation - gangrene & loss of a leg(s), and finally cancer and Alzheimers.    I recommend avoid fried & greasy foods,  sweets/candy, white rice (brown or wild rice or Quinoa is OK), white potatoes (sweet potatoes are OK) - anything made from white flour - bagels, doughnuts, rolls, buns, biscuits,white  and wheat breads, pizza crust and traditional pasta made of white flour & egg white(vegetarian pasta or spinach or wheat pasta is OK).  Multi-grain bread is OK - like multi-grain flat bread or sandwich thins. Avoid alcohol in excess. Exercise is also important.    Eat all the vegetables you want - avoid meat, especially red meat and dairy - especially cheese.  Cheese is the most concentrated form of trans-fats which is the worst thing to clog up our arteries. Veggie cheese is OK which can be found in the fresh produce section at [employer] or [employer] or [employer]  ++++++++++++++++++++++ DASH Eating Plan  DASH stands for "Dietary Approaches to Stop Hypertension."   The DASH eating plan is a healthy eating plan that has been shown to reduce high blood pressure (hypertension). Additional health benefits may include reducing the risk of type 2 diabetes mellitus, heart disease, and stroke. The DASH eating plan may also help with weight loss. WHAT DO I NEED TO KNOW ABOUT THE DASH EATING PLAN? For the DASH eating plan, you will follow these general guidelines:  Choose foods with a percent daily value for sodium of less than 5% (as listed on the food label).  Use salt-free seasonings or herbs instead of table salt or sea salt.  Check with your health care provider or pharmacist before using salt substitutes.  Eat lower-sodium products, often labeled as "lower sodium" or "no salt added."  Eat fresh foods.  Eat more vegetables, fruits, and low-fat dairy products.  Choose whole grains. Look for the word "whole" as the first word in the ingredient list.  Choose fish   Limit sweets, desserts, sugars, and sugary drinks.  Choose heart-healthy fats.  Eat veggie cheese   Eat more home-cooked food and less restaurant, buffet, and fast food.  Limit fried foods.  Cook foods using methods other than frying.  Limit canned  vegetables. If you do use them, rinse them well to decrease the  sodium.  When eating at a restaurant, ask that your food be prepared with less salt, or no salt if possible.                      WHAT FOODS CAN I EAT? Read Dr Fara Olden Fuhrman's books on The End of Dieting & The End of Diabetes  Grains Whole grain or whole wheat bread. Brown rice. Whole grain or whole wheat pasta. Quinoa, bulgur, and whole grain cereals. Low-sodium cereals. Corn or whole wheat flour tortillas. Whole grain cornbread. Whole grain crackers. Low-sodium crackers.  Vegetables Fresh or frozen vegetables (raw, steamed, roasted, or grilled). Low-sodium or reduced-sodium tomato and vegetable juices. Low-sodium or reduced-sodium tomato sauce and paste. Low-sodium or reduced-sodium canned vegetables.   Fruits All fresh, canned (in natural juice), or frozen fruits.  Protein Products  All fish and seafood.  Dried beans, peas, or lentils. Unsalted nuts and seeds. Unsalted canned beans.  Dairy Low-fat dairy products, such as skim or 1% milk, 2% or reduced-fat cheeses, low-fat ricotta or cottage cheese, or plain low-fat yogurt. Low-sodium or reduced-sodium cheeses.  Fats and Oils Tub margarines without trans fats. Light or reduced-fat mayonnaise and salad dressings (reduced sodium). Avocado. Safflower, olive, or canola oils. Natural peanut or almond butter.  Other Unsalted popcorn and pretzels. The items listed above may not be a complete list of recommended foods or beverages. Contact your dietitian for more options.  ++++++++++++++++++  WHAT FOODS ARE NOT RECOMMENDED? Grains/ White flour or wheat flour White bread. White pasta. White rice. Refined cornbread. Bagels and croissants. Crackers that contain trans fat.  Vegetables  Creamed or fried vegetables. Vegetables in a . Regular canned vegetables. Regular canned tomato sauce and paste. Regular tomato and vegetable juices.  Fruits Dried fruits. Canned fruit in light or heavy syrup. Fruit juice.  Meat and Other Protein  Products Meat in general - RED meat & White meat.  Fatty cuts of meat. Ribs, chicken wings, all processed meats as bacon, sausage, bologna, salami, fatback, hot dogs, bratwurst and packaged luncheon meats.  Dairy Whole or 2% milk, cream, half-and-half, and cream cheese. Whole-fat or sweetened yogurt. Full-fat cheeses or blue cheese. Non-dairy creamers and whipped toppings. Processed cheese, cheese spreads, or cheese curds.  Condiments Onion and garlic salt, seasoned salt, table salt, and sea salt. Canned and packaged gravies. Worcestershire sauce. Tartar sauce. Barbecue sauce. Teriyaki sauce. Soy sauce, including reduced sodium. Steak sauce. Fish sauce. Oyster sauce. Cocktail sauce. Horseradish. Ketchup and mustard. Meat flavorings and tenderizers. Bouillon cubes. Hot sauce. Tabasco sauce. Marinades. Taco seasonings. Relishes.  Fats and Oils Butter, stick margarine, lard, shortening and bacon fat. Coconut, palm kernel, or palm oils. Regular salad dressings.  Pickles and olives. Salted popcorn and pretzels.  The items listed above may not be a complete list of foods and beverages to avoid.

## 2016-10-05 ENCOUNTER — Encounter: Payer: Self-pay | Admitting: Internal Medicine

## 2016-10-05 ENCOUNTER — Ambulatory Visit (INDEPENDENT_AMBULATORY_CARE_PROVIDER_SITE_OTHER): Payer: 59 | Admitting: Internal Medicine

## 2016-10-05 VITALS — BP 110/76 | HR 72 | Temp 97.3°F | Resp 16 | Ht 63.0 in | Wt 130.2 lb

## 2016-10-05 DIAGNOSIS — E559 Vitamin D deficiency, unspecified: Secondary | ICD-10-CM

## 2016-10-05 DIAGNOSIS — Z0001 Encounter for general adult medical examination with abnormal findings: Secondary | ICD-10-CM

## 2016-10-05 DIAGNOSIS — I10 Essential (primary) hypertension: Secondary | ICD-10-CM | POA: Diagnosis not present

## 2016-10-05 DIAGNOSIS — Z1212 Encounter for screening for malignant neoplasm of rectum: Secondary | ICD-10-CM

## 2016-10-05 DIAGNOSIS — R7303 Prediabetes: Secondary | ICD-10-CM

## 2016-10-05 DIAGNOSIS — K580 Irritable bowel syndrome with diarrhea: Secondary | ICD-10-CM

## 2016-10-05 DIAGNOSIS — Z0000 Encounter for general adult medical examination without abnormal findings: Secondary | ICD-10-CM

## 2016-10-05 DIAGNOSIS — K219 Gastro-esophageal reflux disease without esophagitis: Secondary | ICD-10-CM

## 2016-10-05 DIAGNOSIS — Z136 Encounter for screening for cardiovascular disorders: Secondary | ICD-10-CM

## 2016-10-05 DIAGNOSIS — R5383 Other fatigue: Secondary | ICD-10-CM

## 2016-10-05 DIAGNOSIS — E785 Hyperlipidemia, unspecified: Secondary | ICD-10-CM

## 2016-10-05 DIAGNOSIS — G43109 Migraine with aura, not intractable, without status migrainosus: Secondary | ICD-10-CM

## 2016-10-05 DIAGNOSIS — Z111 Encounter for screening for respiratory tuberculosis: Secondary | ICD-10-CM | POA: Diagnosis not present

## 2016-10-05 DIAGNOSIS — Z79899 Other long term (current) drug therapy: Secondary | ICD-10-CM

## 2016-10-05 LAB — HEPATIC FUNCTION PANEL
ALK PHOS: 67 U/L (ref 33–115)
ALT: 14 U/L (ref 6–29)
AST: 16 U/L (ref 10–35)
Albumin: 3.8 g/dL (ref 3.6–5.1)
BILIRUBIN INDIRECT: 0.7 mg/dL (ref 0.2–1.2)
Bilirubin, Direct: 0.2 mg/dL (ref <=0.2)
TOTAL PROTEIN: 6.7 g/dL (ref 6.1–8.1)
Total Bilirubin: 0.9 mg/dL (ref 0.2–1.2)

## 2016-10-05 LAB — CBC WITH DIFFERENTIAL/PLATELET
BASOS ABS: 0 {cells}/uL (ref 0–200)
Basophils Relative: 0 %
Eosinophils Absolute: 128 cells/uL (ref 15–500)
Eosinophils Relative: 2 %
HEMATOCRIT: 37.8 % (ref 35.0–45.0)
HEMOGLOBIN: 12.3 g/dL (ref 11.7–15.5)
LYMPHS ABS: 1536 {cells}/uL (ref 850–3900)
Lymphocytes Relative: 24 %
MCH: 30.8 pg (ref 27.0–33.0)
MCHC: 32.5 g/dL (ref 32.0–36.0)
MCV: 94.7 fL (ref 80.0–100.0)
MONO ABS: 320 {cells}/uL (ref 200–950)
MPV: 9.5 fL (ref 7.5–12.5)
Monocytes Relative: 5 %
NEUTROS PCT: 69 %
Neutro Abs: 4416 cells/uL (ref 1500–7800)
Platelets: 281 10*3/uL (ref 140–400)
RBC: 3.99 MIL/uL (ref 3.80–5.10)
RDW: 12.9 % (ref 11.0–15.0)
WBC: 6.4 10*3/uL (ref 3.8–10.8)

## 2016-10-05 LAB — BASIC METABOLIC PANEL WITH GFR
BUN: 12 mg/dL (ref 7–25)
CALCIUM: 8.8 mg/dL (ref 8.6–10.2)
CHLORIDE: 104 mmol/L (ref 98–110)
CO2: 23 mmol/L (ref 20–31)
Creat: 0.75 mg/dL (ref 0.50–1.10)
GFR, Est African American: >89 mL/min (ref >=60)
GLUCOSE: 99 mg/dL (ref 65–99)
Potassium: 4.6 mmol/L (ref 3.5–5.3)
Sodium: 138 mmol/L (ref 135–146)

## 2016-10-05 LAB — IRON AND TIBC
%SAT: 19 % (ref 11–50)
IRON: 73 ug/dL (ref 40–190)
TIBC: 393 ug/dL (ref 250–450)
UIBC: 320 ug/dL

## 2016-10-05 LAB — LIPID PANEL
CHOLESTEROL: 166 mg/dL (ref <200)
HDL: 74 mg/dL (ref >50)
LDL CALC: 73 mg/dL (ref <100)
Total CHOL/HDL Ratio: 2.2 Ratio (ref <5.0)
Triglycerides: 94 mg/dL (ref <150)
VLDL: 19 mg/dL (ref <30)

## 2016-10-05 LAB — TSH: TSH: 2.56 m[IU]/L

## 2016-10-05 MED ORDER — PROPRANOLOL HCL 40 MG PO TABS
ORAL_TABLET | ORAL | 2 refills | Status: DC
Start: 2016-10-05 — End: 2017-11-30

## 2016-10-05 NOTE — Addendum Note (Signed)
Addended by: Melbourne Abts C on: 10/05/2016 04:44 PM   Modules accepted: Orders

## 2016-10-06 LAB — URINALYSIS, ROUTINE W REFLEX MICROSCOPIC
Bilirubin Urine: NEGATIVE
Glucose, UA: NEGATIVE
Hgb urine dipstick: NEGATIVE
Ketones, ur: NEGATIVE
Leukocytes, UA: NEGATIVE
NITRITE: NEGATIVE
Protein, ur: NEGATIVE
SPECIFIC GRAVITY, URINE: 1.014 (ref 1.001–1.035)
pH: 6.5 (ref 5.0–8.0)

## 2016-10-06 LAB — VITAMIN D 25 HYDROXY (VIT D DEFICIENCY, FRACTURES): Vit D, 25-Hydroxy: 109 ng/mL — ABNORMAL HIGH (ref 30–100)

## 2016-10-06 LAB — MICROALBUMIN / CREATININE URINE RATIO
CREATININE, URINE: 88 mg/dL (ref 20–320)
MICROALB UR: 0.6 mg/dL
Microalb Creat Ratio: 7 mcg/mg creat (ref <30)

## 2016-10-06 LAB — INSULIN, RANDOM: Insulin: 9 u[IU]/mL (ref 2.0–19.6)

## 2016-10-06 LAB — VITAMIN B12: VITAMIN B 12: 279 pg/mL (ref 200–1100)

## 2016-10-06 LAB — HEMOGLOBIN A1C
HEMOGLOBIN A1C: 5.3 % (ref <5.7)
MEAN PLASMA GLUCOSE: 105 mg/dL

## 2016-10-06 LAB — MAGNESIUM: Magnesium: 1.8 mg/dL (ref 1.5–2.5)

## 2016-10-07 ENCOUNTER — Encounter: Payer: Self-pay | Admitting: Internal Medicine

## 2016-10-07 LAB — TB SKIN TEST
INDURATION: 0 mm
TB SKIN TEST: NEGATIVE

## 2016-11-24 ENCOUNTER — Other Ambulatory Visit: Payer: Self-pay | Admitting: Gastroenterology

## 2017-01-14 DIAGNOSIS — Z23 Encounter for immunization: Secondary | ICD-10-CM | POA: Diagnosis not present

## 2017-03-03 ENCOUNTER — Other Ambulatory Visit: Payer: Self-pay | Admitting: Gastroenterology

## 2017-05-14 ENCOUNTER — Other Ambulatory Visit: Payer: Self-pay | Admitting: Gastroenterology

## 2017-05-25 ENCOUNTER — Other Ambulatory Visit: Payer: Self-pay | Admitting: Gynecology

## 2017-05-25 DIAGNOSIS — Z1231 Encounter for screening mammogram for malignant neoplasm of breast: Secondary | ICD-10-CM

## 2017-05-31 ENCOUNTER — Telehealth: Payer: Self-pay | Admitting: *Deleted

## 2017-05-31 MED ORDER — NORGESTIM-ETH ESTRAD TRIPHASIC 0.18/0.215/0.25 MG-35 MCG PO TABS: 1.0000 | ORAL_TABLET | Freq: Every day | ORAL | 1 refills | Status: DC

## 2017-05-31 NOTE — Telephone Encounter (Signed)
Pt has annual scheduled on 07/13/17 needs refill on birth control pills. Rx sent.

## 2017-06-11 ENCOUNTER — Ambulatory Visit
Admission: RE | Admit: 2017-06-11 | Discharge: 2017-06-11 | Disposition: A | Discharge status: Home / Self Care | Payer: 59 | Source: Ambulatory Visit | Attending: Gynecology | Admitting: Gynecology

## 2017-06-11 DIAGNOSIS — Z1231 Encounter for screening mammogram for malignant neoplasm of breast: Secondary | ICD-10-CM | POA: Diagnosis not present

## 2017-06-14 ENCOUNTER — Other Ambulatory Visit: Payer: Self-pay | Admitting: Gynecology

## 2017-06-14 DIAGNOSIS — R928 Other abnormal and inconclusive findings on diagnostic imaging of breast: Secondary | ICD-10-CM

## 2017-06-18 ENCOUNTER — Ambulatory Visit
Admission: RE | Admit: 2017-06-18 | Discharge: 2017-06-18 | Disposition: A | Discharge status: Home / Self Care | Payer: 59 | Source: Ambulatory Visit | Attending: Gynecology | Admitting: Gynecology

## 2017-06-18 DIAGNOSIS — R928 Other abnormal and inconclusive findings on diagnostic imaging of breast: Secondary | ICD-10-CM

## 2017-06-18 DIAGNOSIS — N6002 Solitary cyst of left breast: Secondary | ICD-10-CM | POA: Diagnosis not present

## 2017-07-07 ENCOUNTER — Encounter: Payer: Self-pay | Admitting: Internal Medicine

## 2017-07-13 ENCOUNTER — Ambulatory Visit: Payer: 59 | Admitting: Gynecology

## 2017-07-13 ENCOUNTER — Encounter: Payer: Self-pay | Admitting: Gynecology

## 2017-07-13 VITALS — BP 118/76 | Ht 63.0 in | Wt 134.0 lb

## 2017-07-13 DIAGNOSIS — Z01419 Encounter for gynecological examination (general) (routine) without abnormal findings: Secondary | ICD-10-CM

## 2017-07-13 MED ORDER — NORGESTIM-ETH ESTRAD TRIPHASIC 0.18/0.215/0.25 MG-35 MCG PO TABS: 1.0000 | ORAL_TABLET | Freq: Every day | ORAL | 12 refills | Status: DC

## 2017-07-13 NOTE — Progress Notes (Signed)
    Tina Holmes 1970-12-10 [id number]        46 y.o.  [id number] for annual gynecologic exam.  Doing well without gynecologic complaints.  Past medical history,surgical history, problem list, medications, allergies, family history and social history were all reviewed and documented as reviewed in the EPIC chart.  ROS:  Performed with pertinent positives and negatives included in the history, assessment and plan.   Additional significant findings : None   Exam: Caryn Bee assistant Vitals:   07/13/17 1525  BP: 118/76  Weight: 134 lb (60.8 kg)  Height: 5\' 3"  (1.6 m)   Body mass index is 23.74 kg/m.  General appearance:  Normal affect, orientation and appearance. Skin: Grossly normal HEENT: Without gross lesions.  No cervical or supraclavicular adenopathy. Thyroid normal.  Lungs:  Clear without wheezing, rales or rhonchi Cardiac: RR, without RMG Abdominal:  Soft, nontender, without masses, guarding, rebound, organomegaly or hernia Breasts:  Examined lying and sitting without masses, retractions, discharge or axillary adenopathy. Pelvic:  Ext, BUS, Vagina: Normal  Cervix: Normal.  Pap smear done  Uterus: Anteverted, normal size, shape and contour, midline and mobile nontender   Adnexa: Without masses or tenderness    Anus and perineum: Normal   Rectovaginal: Normal sphincter tone without palpated masses or tenderness.    Assessment/Plan:  47 y.o. [id number] female for annual gynecologic exam with regular menses, oral contraceptives.   1. Oral contraceptives.  Continues on Ortho Tri-Cyclen equivalent doing well.  We again discussed the risks of thrombosis.  Never smoked and not being followed for any medical issues.  Patient is comfortable continuing.  Refill times 1 year provided. 2. Pap smear 2018.  Patient uncomfortable with skipping Pap smears.  Pap smear done today.  No history of abnormal Pap smears previously. 3. Mammography 05/2017.  Had call back and subsequent  ultrasound which showed small cyst in left breast.  Exam normal today.  No palpable abnormalities.  Recommended repeat mammogram per radiology 1 year. 4. Health maintenance.  No routine lab work done as patient does this elsewhere.  Follow-up 1 year, sooner as needed.   Anastasio Auerbach MD, 4:07 PM 07/13/2017

## 2017-07-13 NOTE — Patient Instructions (Signed)
Follow-up in 1 year for annual exam, sooner as needed. 

## 2017-07-13 NOTE — Addendum Note (Signed)
Addended by: Nelva Nay on: 07/13/2017 04:17 PM   Modules accepted: Orders

## 2017-07-14 LAB — PAP IG W/ RFLX HPV ASCU

## 2017-09-05 ENCOUNTER — Other Ambulatory Visit: Payer: Self-pay | Admitting: Gastroenterology

## 2017-11-02 ENCOUNTER — Ambulatory Visit: Payer: 59 | Admitting: Internal Medicine

## 2017-11-02 ENCOUNTER — Encounter: Payer: Self-pay | Admitting: Internal Medicine

## 2017-11-02 VITALS — BP 122/76 | HR 76 | Temp 97.3°F | Resp 16 | Ht 63.0 in | Wt 136.0 lb

## 2017-11-02 DIAGNOSIS — Z111 Encounter for screening for respiratory tuberculosis: Secondary | ICD-10-CM

## 2017-11-02 DIAGNOSIS — E785 Hyperlipidemia, unspecified: Secondary | ICD-10-CM

## 2017-11-02 DIAGNOSIS — Z79899 Other long term (current) drug therapy: Secondary | ICD-10-CM

## 2017-11-02 DIAGNOSIS — Z136 Encounter for screening for cardiovascular disorders: Secondary | ICD-10-CM

## 2017-11-02 DIAGNOSIS — R5383 Other fatigue: Secondary | ICD-10-CM

## 2017-11-02 DIAGNOSIS — G43109 Migraine with aura, not intractable, without status migrainosus: Secondary | ICD-10-CM

## 2017-11-02 DIAGNOSIS — Z87891 Personal history of nicotine dependence: Secondary | ICD-10-CM | POA: Diagnosis not present

## 2017-11-02 DIAGNOSIS — R7303 Prediabetes: Secondary | ICD-10-CM

## 2017-11-02 DIAGNOSIS — Z8249 Family history of ischemic heart disease and other diseases of the circulatory system: Secondary | ICD-10-CM | POA: Diagnosis not present

## 2017-11-02 DIAGNOSIS — Z1211 Encounter for screening for malignant neoplasm of colon: Secondary | ICD-10-CM

## 2017-11-02 DIAGNOSIS — E559 Vitamin D deficiency, unspecified: Secondary | ICD-10-CM

## 2017-11-02 DIAGNOSIS — Z1212 Encounter for screening for malignant neoplasm of rectum: Secondary | ICD-10-CM

## 2017-11-02 DIAGNOSIS — Z0001 Encounter for general adult medical examination with abnormal findings: Secondary | ICD-10-CM | POA: Diagnosis not present

## 2017-11-02 DIAGNOSIS — K219 Gastro-esophageal reflux disease without esophagitis: Secondary | ICD-10-CM

## 2017-11-02 MED ORDER — TOPIRAMATE 50 MG PO TABS: ORAL_TABLET | ORAL | 1 refills | Status: DC

## 2017-11-02 NOTE — Progress Notes (Signed)
[facility] Unk Pinto, M.D.     Uvaldo Bristle. Silverio Lay, P.A.-C Liane Comber, [facility] [address] [place] [address] Telephone [phone] Telefax [phone] Annual Screening/Preventative Visit & Comprehensive Evaluation &  Examination     This very nice 47 y.o. MWF  presents for a Screening /Preventative Visit & comprehensive evaluation and management of multiple medical co-morbidities.  Patient has been followed for HTN, HLD, Prediabetes  and Vitamin D Deficiency. Patient has GERD controlled on current meds. Patient also gas IBS-D controlled on Colestid.       Patient is monitored expectantly for HTN. Patient's BP has been controlled at home and patient denies any cardiac symptoms as chest pain, palpitations, shortness of breath, dizziness or ankle swelling. Today's BP is at goal - 122/76.      Patient's hyperlipidemia is controlled with diet and medications. Patient denies myalgias or other medication SE's. Last lipids were at goal: Lab Results  Component Value Date   CHOL 166 10/05/2016   HDL 74 10/05/2016   LDLCALC 73 10/05/2016   TRIG 94 10/05/2016   CHOLHDL 2.2 10/05/2016      Patient has hx/o prediabetes (A1c 5.8%/2012)  and patient denies reactive hypoglycemic symptoms, visual blurring, diabetic polys, or paresthesias. Last A1c was Normal & at goal: Lab Results  Component Value Date   HGBA1C 5.3 10/05/2016      Finally, patient has history of Vitamin D Deficiency ("40" on tx in 2009) and last Vitamin D was sl elevated & dose was reduced: Lab Results  Component Value Date   VD25OH 109 (H) 10/05/2016   Current Outpatient Medications on File Prior to Visit  Medication Sig  . aspirin EC 81 MG tablet Take 81 mg by mouth daily.  . Cholecalciferol (VITAMIN D PO) Take 6,000 Units by mouth.    . colestipol (COLESTID) 1 g tablet TAKE 1 TABLET BY MOUTH TWICE A DAY  . Loratadine (CLARITIN  PO) Take 1 tablet by mouth.   . Norgestimate-Ethinyl Estradiol Triphasic (TRI-PREVIFEM) 0.18/0.215/0.25 MG-35 MCG tablet Take 1 tablet by mouth daily.  . pantoprazole (PROTONIX) 40 MG tablet TAKE 1 TABLET BY MOUTH EVERY DAY  . ranitidine (ZANTAC) 150 MG tablet Take 1 tablet (150 mg total) by mouth at bedtime.  . propranolol (INDERAL) 40 MG tablet Take 1 tablet 3 to 4 x / day for Migraine   No current facility-administered medications on file prior to visit.    Allergies  Allergen Reactions  . Imitrex [Sumatriptan]     Throat swelling  . Viberzi [Eluxadoline]     Throat swelling   . Celexa [Citalopram]     Decreased libido  . Codeine     N/V  . Zoloft [Sertraline Hcl]     N/V   Past Medical History:  Diagnosis Date  . Anxiety   . Depression   . GERD (gastroesophageal reflux disease)   . IBS (irritable bowel syndrome)   . Migraines   . Restless leg syndrome    Health Maintenance  Topic Date Due  . INFLUENZA VACCINE  10/28/2017  . PAP SMEAR  07/13/2020  . TETANUS/TDAP  07/21/2026  . HIV Screening  Completed   Immunization History  Administered Date(s) Administered  . Influenza Split 01/14/2011, 02/16/2013, 01/10/2015  . Influenza-Unspecified 02/11/2014  . PPD Test 08/10/2013, 08/14/2014, 08/15/2015, 10/05/2016  . Pneumococcal-Unspecified 07/29/2010  . Td 03/31/2003  . Tdap 08/14/2014, 07/20/2016   EGD - 12/17/2005 - Dr Ardis Hughs - Showed  GERD  Last MGM - 06/14/2017, Pap due 12/2017 w/Dr Phineas Real   Past Surgical History:  Procedure Laterality Date  . CHOLECYSTECTOMY    . TERMINATION OF PREGNANC     18 WEEKS--DOWNS SYNDROME   Family History  Problem Relation Age of Onset  . Diabetes Mother   . Hypertension Mother   . Heart attack Mother   . Hypertension Father   . CVA Maternal Grandfather    Social History   Tobacco Use  . Smoking status: Former Smoker    Last attempt to quit: 03/30/1996    Years since quitting: 21.6  . Smokeless tobacco: Never Used   Substance Use Topics  . Alcohol use: Yes    Alcohol/week: 0.0 oz    Comment: OCC  . Drug use: No    ROS Constitutional: Denies fever, chills, weight loss/gain, headaches, insomnia,  night sweats, and change in appetite. Does c/o fatigue. Eyes: Denies redness, blurred vision, diplopia, discharge, itchy, watery eyes.  ENT: Denies discharge, congestion, post nasal drip, epistaxis, sore throat, earache, hearing loss, dental pain, Tinnitus, Vertigo, Sinus pain, snoring.  Cardio: Denies chest pain, palpitations, irregular heartbeat, syncope, dyspnea, diaphoresis, orthopnea, PND, claudication, edema Respiratory: denies cough, dyspnea, DOE, pleurisy, hoarseness, laryngitis, wheezing.  Gastrointestinal: Denies dysphagia, heartburn, reflux, water brash, pain, cramps, nausea, vomiting, bloating, diarrhea, constipation, hematemesis, melena, hematochezia, jaundice, hemorrhoids Genitourinary: Denies dysuria, frequency, urgency, nocturia, hesitancy, discharge, hematuria, flank pain Breast: Breast lumps, nipple discharge, bleeding.  Musculoskeletal: Denies arthralgia, myalgia, stiffness, Jt. Swelling, pain, limp, and strain/sprain. Denies falls. Skin: Denies puritis, rash, hives, warts, acne, eczema, changing in skin lesion Neuro: No weakness, tremor, incoordination, spasms, paresthesia, pain Psychiatric: Denies confusion, memory loss, sensory loss. Denies Depression. Endocrine: Denies change in weight, skin, hair change, nocturia, and paresthesia, diabetic polys, visual blurring, hyper / hypo glycemic episodes.  Heme/Lymph: No excessive bleeding, bruising, enlarged lymph nodes.  Physical Exam  BP 122/76   Pulse 76   Temp (!) 97.3 F (36.3 C)   Resp 16   Ht 5\' 3"  (1.6 m)   Wt 136 lb (61.7 kg)   BMI 24.09 kg/m   General Appearance: Well nourished, well groomed and in no apparent distress.  Eyes: PERRLA, EOMs, conjunctiva no swelling or erythema, normal fundi and vessels. Sinuses: No  frontal/maxillary tenderness ENT/Mouth: EACs patent / TMs  nl. Nares clear without erythema, swelling, mucoid exudates. Oral hygiene is good. No erythema, swelling, or exudate. Tongue normal, non-obstructing. Tonsils not swollen or erythematous. Hearing normal.  Neck: Supple, thyroid not palpable. No bruits, nodes or JVD. Respiratory: Respiratory effort normal.  BS equal and clear bilateral without rales, rhonci, wheezing or stridor. Cardio: Heart sounds are normal with regular rate and rhythm and no murmurs, rubs or gallops. Peripheral pulses are normal and equal bilaterally without edema. No aortic or femoral bruits. Chest: symmetric with normal excursions and percussion. Breasts: Symmetric, without lumps, nipple discharge, retractions, or fibrocystic changes.  Abdomen: Flat, soft with bowel sounds active. Nontender, no guarding, rebound, hernias, masses, or organomegaly.  Lymphatics: Non tender without lymphadenopathy.  Genitourinary:  Musculoskeletal: Full ROM all peripheral extremities, joint stability, 5/5 strength, and normal gait. Skin: Warm and dry without rashes, lesions, cyanosis, clubbing or  ecchymosis.  Neuro: Cranial nerves intact, reflexes equal bilaterally. Normal muscle tone, no cerebellar symptoms. Sensation intact.  Pysch: Alert and oriented X 3, normal affect, Insight and Judgment appropriate.   Assessment and Plan  1. Annual Preventative Screening Examination  2. Hypertension screen  - EKG 12-Lead - Urinalysis,  Routine w reflex microscopic - Microalbumin / creatinine urine ratio - CBC with Differential/Platelet - COMPLETE METABOLIC PANEL WITH GFR - Magnesium - TSH  3. Elevated lipids  - EKG 12-Lead - Lipid panel - TSH  4. Prediabetes  - EKG 12-Lead - Hemoglobin A1c - Insulin, random  5. Vitamin D deficiency  - VITAMIN D 25 Hydroxyl  6. Gastroesophageal reflux disease  - CBC with Differential/Platelet  7. Screening for colorectal cancer  - POC  Hemoccult Bld/Stl  8. Screening for ischemic heart disease  - EKG 12-Lead  9. Former smoker  - EKG 12-Lead  10. FHx: heart disease  - EKG 12-Lead  11. Fatigue  - Iron,Total/Total Iron Binding Cap - Vitamin B12 - CBC with Differential/Platelet - TSH  12. Medication management  - Urinalysis, Routine w reflex microscopic - Microalbumin / creatinine urine ratio - CBC with Differential/Platelet - COMPLETE METABOLIC PANEL WITH GFR - Magnesium - Lipid panel - TSH - Hemoglobin A1c - Insulin, random - VITAMIN D 25 Hydroxyl  13. Screening examination for pulmonary tuberculosis  - PPD  14. Migraine with aura and without status migrainosus, not intractable  - topiramate (TOPAMAX) 50 MG tablet; Take 2 tablets at Bedtime for Migraine Prophylaxis  Dispense: 180 tablet; Refill: 1     Patient was counseled in prudent diet to achieve/maintain BMI less than 25 for weight control, BP monitoring, regular exercise and medications. Discussed med's effects and SE's. Screening labs and tests as requested with regular follow-up as recommended. Over 40 minutes of exam, counseling, chart review and high complex critical decision making was performed.

## 2017-11-02 NOTE — Patient Instructions (Signed)

## 2017-11-03 LAB — CBC WITH DIFFERENTIAL/PLATELET
Basophils Absolute: 19 cells/uL (ref 0–200)
Basophils Relative: 0.3 %
EOS ABS: 107 {cells}/uL (ref 15–500)
Eosinophils Relative: 1.7 %
HCT: 35.8 % (ref 35.0–45.0)
HEMOGLOBIN: 12.2 g/dL (ref 11.7–15.5)
LYMPHS ABS: 1544 {cells}/uL (ref 850–3900)
MCH: 31 pg (ref 27.0–33.0)
MCHC: 34.1 g/dL (ref 32.0–36.0)
MCV: 91.1 fL (ref 80.0–100.0)
MPV: 10.2 fL (ref 7.5–12.5)
Monocytes Relative: 7.2 %
NEUTROS ABS: 4177 {cells}/uL (ref 1500–7800)
Neutrophils Relative %: 66.3 %
Platelets: 278 10*3/uL (ref 140–400)
RBC: 3.93 10*6/uL (ref 3.80–5.10)
RDW: 11.4 % (ref 11.0–15.0)
Total Lymphocyte: 24.5 %
WBC: 6.3 10*3/uL (ref 3.8–10.8)
WBCMIX: 454 {cells}/uL (ref 200–950)

## 2017-11-03 LAB — INSULIN, RANDOM: INSULIN: 16.3 u[IU]/mL (ref 2.0–19.6)

## 2017-11-03 LAB — MICROALBUMIN / CREATININE URINE RATIO
Creatinine, Urine: 33 mg/dL (ref 20–275)
MICROALB/CREAT RATIO: 12 ug/mg{creat} (ref <30)
Microalb, Ur: 0.4 mg/dL

## 2017-11-03 LAB — IRON, TOTAL/TOTAL IRON BINDING CAP
%SAT: 17 % (ref 16–45)
IRON: 61 ug/dL (ref 40–190)
TIBC: 368 ug/dL (ref 250–450)

## 2017-11-03 LAB — URINALYSIS, ROUTINE W REFLEX MICROSCOPIC
Bilirubin Urine: NEGATIVE
GLUCOSE, UA: NEGATIVE
Hgb urine dipstick: NEGATIVE
Ketones, ur: NEGATIVE
LEUKOCYTES UA: NEGATIVE
Nitrite: NEGATIVE
Protein, ur: NEGATIVE
SPECIFIC GRAVITY, URINE: 1.008 (ref 1.001–1.03)
pH: 7 (ref 5.0–8.0)

## 2017-11-03 LAB — LIPID PANEL
Cholesterol: 177 mg/dL (ref <200)
HDL: 65 mg/dL (ref >50)
LDL Cholesterol (Calc): 91 mg/dL (calc)
NON-HDL CHOLESTEROL (CALC): 112 mg/dL (ref <130)
Total CHOL/HDL Ratio: 2.7 (calc) (ref <5.0)
Triglycerides: 115 mg/dL (ref <150)

## 2017-11-03 LAB — HEMOGLOBIN A1C
EAG (MMOL/L): 6.2 (calc)
HEMOGLOBIN A1C: 5.5 %{Hb} (ref <5.7)
Mean Plasma Glucose: 111 (calc)

## 2017-11-03 LAB — COMPLETE METABOLIC PANEL WITH GFR
AG RATIO: 1.6 (calc) (ref 1.0–2.5)
ALKALINE PHOSPHATASE (APISO): 64 U/L (ref 33–115)
ALT: 14 U/L (ref 6–29)
AST: 15 U/L (ref 10–35)
Albumin: 4.2 g/dL (ref 3.6–5.1)
BILIRUBIN TOTAL: 1.1 mg/dL (ref 0.2–1.2)
BUN: 11 mg/dL (ref 7–25)
CHLORIDE: 103 mmol/L (ref 98–110)
CO2: 25 mmol/L (ref 20–32)
Calcium: 8.8 mg/dL (ref 8.6–10.2)
Creat: 0.7 mg/dL (ref 0.50–1.10)
GFR, EST AFRICAN AMERICAN: 118 mL/min/{1.73_m2} (ref >=60)
GFR, Est Non African American: 102 mL/min/{1.73_m2} (ref >=60)
Globulin: 2.6 g/dL (calc) (ref 1.9–3.7)
Glucose, Bld: 123 mg/dL — ABNORMAL HIGH (ref 65–99)
POTASSIUM: 3.6 mmol/L (ref 3.5–5.3)
Sodium: 137 mmol/L (ref 135–146)
TOTAL PROTEIN: 6.8 g/dL (ref 6.1–8.1)

## 2017-11-03 LAB — TSH: TSH: 3.65 m[IU]/L

## 2017-11-03 LAB — VITAMIN D 25 HYDROXY (VIT D DEFICIENCY, FRACTURES): Vit D, 25-Hydroxy: 124 ng/mL — ABNORMAL HIGH (ref 30–100)

## 2017-11-03 LAB — VITAMIN B12: VITAMIN B 12: 254 pg/mL (ref 200–1100)

## 2017-11-03 LAB — MAGNESIUM: Magnesium: 1.7 mg/dL (ref 1.5–2.5)

## 2017-11-04 LAB — TB SKIN TEST
Induration: 0 mm
TB Skin Test: NEGATIVE

## 2017-11-30 ENCOUNTER — Other Ambulatory Visit: Payer: Self-pay

## 2017-11-30 DIAGNOSIS — G43109 Migraine with aura, not intractable, without status migrainosus: Secondary | ICD-10-CM

## 2017-11-30 MED ORDER — PROPRANOLOL HCL 40 MG PO TABS: ORAL_TABLET | ORAL | 2 refills | Status: DC

## 2017-12-30 ENCOUNTER — Other Ambulatory Visit: Payer: Self-pay | Admitting: Gastroenterology

## 2018-01-14 ENCOUNTER — Other Ambulatory Visit: Payer: Self-pay | Admitting: Gastroenterology

## 2018-01-19 ENCOUNTER — Telehealth: Payer: Self-pay | Admitting: Gastroenterology

## 2018-01-19 ENCOUNTER — Other Ambulatory Visit: Payer: Self-pay | Admitting: Gastroenterology

## 2018-01-19 MED ORDER — COLESTIPOL HCL 1 G PO TABS: 1.0000 g | ORAL_TABLET | Freq: Two times a day (BID) | ORAL | 1 refills | Status: DC

## 2018-01-19 NOTE — Telephone Encounter (Signed)
Spoke to patient. Enough refills on Colestid sent to pharmacy to get her through until December appointment.

## 2018-01-20 ENCOUNTER — Other Ambulatory Visit: Payer: Self-pay | Admitting: Gastroenterology

## 2018-01-30 DIAGNOSIS — Z23 Encounter for immunization: Secondary | ICD-10-CM | POA: Diagnosis not present

## 2018-02-14 ENCOUNTER — Other Ambulatory Visit: Payer: Self-pay | Admitting: Gastroenterology

## 2018-03-01 ENCOUNTER — Encounter: Payer: Self-pay | Admitting: Gastroenterology

## 2018-03-01 ENCOUNTER — Ambulatory Visit: Payer: 59 | Admitting: Gastroenterology

## 2018-03-01 VITALS — BP 116/74 | HR 68

## 2018-03-01 DIAGNOSIS — R197 Diarrhea, unspecified: Secondary | ICD-10-CM

## 2018-03-01 MED ORDER — COLESTIPOL HCL 1 G PO TABS: 1.0000 g | ORAL_TABLET | Freq: Every day | ORAL | 11 refills | Status: DC

## 2018-03-01 NOTE — Patient Instructions (Addendum)
Colonoscopy June 2020 for routine risk colon cancer screening. colestid 1gm pills, one pill daily, disp 30 with 11 refills.  Thank you for entrusting me with your care and choosing [facility].  Dr Ardis Hughs

## 2018-03-01 NOTE — Progress Notes (Signed)
Review of pertinent gastrointestinal problems: 1. GERD in September 2007. She had EGD on 12/17/2005 which was a normal examination. She was felt to have nonerosive GERD. 2. Chronic loose stools, likely bile acid related; started after GB resection (1998).  Cholestyramine started 2016 has significantly helped.    HPI: This is a very pleasant 47 year old woman whom I last saw about 2 years ago.  She is here for medication refills for her bile acid related diarrhea  She takes Colestid 1 g 1 pill once daily and on that regimen she very rarely has diarrhea.  If she skips even a day or 2 then she has significant diarrhea again.  She does not have any overt bleeding.  She has no significant abdominal pains.  Her weight is up about 12 pounds in the last 3 months.  Chief complaint is bile acid related diarrhea  ROS: complete GI ROS as described in HPI, all other review negative.  Constitutional:  No unintentional weight loss   Past Medical History:  Diagnosis Date  . Anxiety   . Depression   . GERD (gastroesophageal reflux disease)   . IBS (irritable bowel syndrome)   . Migraines   . Restless leg syndrome     Past Surgical History:  Procedure Laterality Date  . CHOLECYSTECTOMY    . TERMINATION OF PREGNANC     18 WEEKS--DOWNS SYNDROME    Current Outpatient Medications  Medication Sig Dispense Refill  . aspirin EC 81 MG tablet Take 81 mg by mouth daily.    . Cholecalciferol (VITAMIN D PO) Take 6,000 Units by mouth.      . colestipol (COLESTID) 1 g tablet TAKE 1 TABLET BY MOUTH TWICE A DAY 60 tablet 1  . Loratadine (CLARITIN PO) Take 1 tablet by mouth daily as needed.     . Norgestimate-Ethinyl Estradiol Triphasic (TRI-PREVIFEM) 0.18/0.215/0.25 MG-35 MCG tablet Take 1 tablet by mouth daily. 28 tablet 12  . pantoprazole (PROTONIX) 40 MG tablet TAKE 1 TABLET BY MOUTH EVERY DAY 30 tablet 2  . ranitidine (ZANTAC) 150 MG tablet Take 1 tablet (150 mg total) by mouth at bedtime. (Patient  taking differently: Take 150 mg by mouth at bedtime as needed. ) 30 tablet 6  . propranolol (INDERAL) 40 MG tablet Take 1 tablet 3 to 4 x / day for Migraine 30 tablet 2   No current facility-administered medications for this visit.     Allergies as of 03/01/2018 - Review Complete 03/01/2018  Allergen Reaction Noted  . Imitrex [sumatriptan]  02/16/2013  . Viberzi [eluxadoline]  08/06/2015  . Celexa [citalopram]  02/15/2013  . Codeine  02/15/2013  . Topamax [topiramate]  03/01/2018  . Zoloft [sertraline hcl]  02/15/2013    Family History  Problem Relation Age of Onset  . Diabetes Mother   . Hypertension Mother   . Heart attack Mother   . Hypertension Father   . CVA Maternal Grandfather     Social History   Socioeconomic History  . Marital status: Married    Spouse name: Not on file  . Number of children: 1  . Years of education: Not on file  . Highest education level: Not on file  Occupational History  . Occupation: Chief Financial Officer  Social Needs  . Financial resource strain: Not on file  . Food insecurity:    Worry: Not on file    Inability: Not on file  . Transportation needs:    Medical: Not on file    Non-medical: Not on file  Tobacco Use  . Smoking status: Former Smoker    Last attempt to quit: 03/30/1996    Years since quitting: 21.9  . Smokeless tobacco: Never Used  Substance and Sexual Activity  . Alcohol use: Yes    Alcohol/week: 0.0 standard drinks    Comment: OCC  . Drug use: No  . Sexual activity: Yes    Birth control/protection: Pill    Comment: 1st intercourse 47 yo-Fewer than 5 partners  Lifestyle  . Physical activity:    Days per week: Not on file    Minutes per session: Not on file  . Stress: Not on file  Relationships  . Social connections:    Talks on phone: Not on file    Gets together: Not on file    Attends religious service: Not on file    Active member of club or organization: Not on file    Attends meetings of clubs or  organizations: Not on file    Relationship status: Not on file  . Intimate partner violence:    Fear of current or ex partner: Not on file    Emotionally abused: Not on file    Physically abused: Not on file    Forced sexual activity: Not on file  Other Topics Concern  . Not on file  Social History Narrative  . Not on file     Physical Exam: BP 116/74   Pulse 68  Constitutional: generally well-appearing Psychiatric: alert and oriented x3 Abdomen: soft, nontender, nondistended, no obvious ascites, no peritoneal signs, normal bowel sounds No peripheral edema noted in lower extremities  Assessment and plan: 47 y.o. female with bile acid related diarrhea  I am happy to give her a refill prescription for Colestid 1 g pills.  1 pill once daily.  I will refill this again in a year from now but if she still needs prescription medicines in 2 years she understands I would like to see her in the office.  She will be 48 next year and we will send her a colon cancer screening reminder around that time.  Please see the "Patient Instructions" section for addition details about the plan.  Tina Loffler, MD [facility] 03/01/2018, 1:48 PM

## 2018-03-14 ENCOUNTER — Other Ambulatory Visit: Payer: Self-pay | Admitting: Gastroenterology

## 2018-03-28 ENCOUNTER — Other Ambulatory Visit: Payer: Self-pay | Admitting: Internal Medicine

## 2018-03-28 ENCOUNTER — Ambulatory Visit: Payer: 59 | Admitting: *Deleted

## 2018-03-28 VITALS — BP 110/64 | HR 68 | Temp 97.4°F | Ht 63.0 in | Wt 143.6 lb

## 2018-03-28 DIAGNOSIS — R300 Dysuria: Secondary | ICD-10-CM

## 2018-03-28 DIAGNOSIS — N3000 Acute cystitis without hematuria: Secondary | ICD-10-CM

## 2018-03-28 MED ORDER — CIPROFLOXACIN HCL 250 MG PO TABS: ORAL_TABLET | ORAL | 0 refills | Status: DC

## 2018-03-28 NOTE — Addendum Note (Signed)
Addended by: Dolores Hoose on: 03/28/2018 11:30 AM   Modules accepted: Orders

## 2018-03-28 NOTE — Progress Notes (Signed)
Patient is here for a NV to check a UA and a C and S urine. She complained of nausea with left back pain, which was worse over the weekend. She is having urgency and frequency, with low urine output. An RX will be sent to her pharmacy by Dr Melford Aase.

## 2018-03-29 LAB — URINALYSIS, ROUTINE W REFLEX MICROSCOPIC
BILIRUBIN URINE: NEGATIVE
GLUCOSE, UA: NEGATIVE
HGB URINE DIPSTICK: NEGATIVE
Ketones, ur: NEGATIVE
LEUKOCYTES UA: NEGATIVE
Nitrite: NEGATIVE
PH: 6 (ref 5.0–8.0)
Protein, ur: NEGATIVE
Specific Gravity, Urine: 1.004 (ref 1.001–1.03)

## 2018-03-29 LAB — URINE CULTURE
MICRO NUMBER:: 91553146
SPECIMEN QUALITY:: ADEQUATE

## 2018-04-09 ENCOUNTER — Other Ambulatory Visit: Payer: Self-pay | Admitting: Gastroenterology

## 2018-04-12 ENCOUNTER — Other Ambulatory Visit: Payer: Self-pay | Admitting: Internal Medicine

## 2018-04-12 DIAGNOSIS — G43109 Migraine with aura, not intractable, without status migrainosus: Secondary | ICD-10-CM

## 2018-04-12 MED ORDER — ZONISAMIDE 100 MG PO CAPS: ORAL_CAPSULE | ORAL | 2 refills | Status: DC

## 2018-05-06 ENCOUNTER — Other Ambulatory Visit: Payer: Self-pay

## 2018-05-06 MED ORDER — COLESTIPOL HCL 1 G PO TABS: 1.0000 g | ORAL_TABLET | Freq: Every day | ORAL | 11 refills | Status: DC

## 2018-05-06 NOTE — Telephone Encounter (Signed)
According to last office note from Dec. Patient is to continue her colestipol once daily.

## 2018-05-11 NOTE — Progress Notes (Signed)
FOLLOW UP  Assessment and Plan:   Abnormal menses -     Follicle stimulating hormone -? Menopausal symptoms, will check labs  Insomnia, unspecified type -     CBC with Differential/Platelet -     COMPLETE METABOLIC PANEL WITH GFR -     TSH  Migraine with aura and without status migrainosus, not intractable Continue follow up nueor   Continue diet and meds as discussed. Further disposition pending results of labs. Over 30 minutes of exam, counseling, chart review, and critical decision making was performed  Future Appointments  Date Time Provider [place]  05/12/2018  3:30 PM Vicie Mutters, PA-C [facility] None  07/15/2018  3:30 PM Fontaine, Belinda Block, MD [facility]  12/01/2018  3:00 PM Unk Pinto, MD [facility] None     HPI 48 y.o. female  presents for 3 month follow up on hypertension, cholesterol, prediabetes, and vitamin D deficiency.   She is on BCP but she has been having her menses being longer between pills, she has been having trouble sleeping, feeling she can not handle things as well, will cry randomly or get very angry which is not like her.   Patient is on Vitamin D supplement, he is on 10,000 one a day, not changed Lab Results  Component Value Date   VD25OH 124 (H) 11/02/2017         Current Medications:  Current Outpatient Medications on File Prior to Visit  Medication Sig  . aspirin EC 81 MG tablet Take 81 mg by mouth daily.  . Cholecalciferol (VITAMIN D PO) Take 6,000 Units by mouth.    . colestipol (COLESTID) 1 g tablet Take 1 tablet (1 g total) by mouth daily.  . Loratadine (CLARITIN PO) Take 1 tablet by mouth daily as needed.   . Norgestimate-Ethinyl Estradiol Triphasic (TRI-PREVIFEM) 0.18/0.215/0.25 MG-35 MCG tablet Take 1 tablet by mouth daily.  . pantoprazole (PROTONIX) 40 MG tablet TAKE 1 TABLET BY MOUTH EVERY DAY  . propranolol (INDERAL) 40 MG tablet Take 1 tablet 3 to 4 x / day for Migraine  . ranitidine (ZANTAC) 150 MG tablet  Take 1 tablet (150 mg total) by mouth at bedtime. (Patient taking differently: Take 150 mg by mouth at bedtime as needed. )  . zonisamide (ZONEGRAN) 100 MG capsule Take 3 capsules  /daily for Migraine   Prevention   No current facility-administered medications on file prior to visit.     Medical History:  Past Medical History:  Diagnosis Date  . Anxiety   . Depression   . GERD (gastroesophageal reflux disease)   . IBS (irritable bowel syndrome)   . Migraines   . Restless leg syndrome    Allergies:  Allergies  Allergen Reactions  . Imitrex [Sumatriptan]     Throat swelling  . Viberzi [Eluxadoline]     Throat swelling   . Celexa [Citalopram]     Decreased libido  . Codeine     N/V  . Topamax [Topiramate]     Neurological changes  . Zoloft [Sertraline Hcl]     N/V     Review of Systems:  ROS  See above  Family history- Review and unchanged Social history- Review and unchanged Physical Exam: There were no vitals taken for this visit. Wt Readings from Last 3 Encounters:  03/28/18 143 lb 9.6 oz (65.1 kg)  11/02/17 136 lb (61.7 kg)  07/13/17 134 lb (60.8 kg)   General Appearance: Well nourished, in no apparent distress. Eyes: PERRLA, EOMs, conjunctiva no  swelling or erythema Sinuses: No Frontal/maxillary tenderness ENT/Mouth: Ext aud canals clear, TMs without erythema, bulging. No erythema, swelling, or exudate on post pharynx.  Tonsils not swollen or erythematous. Hearing normal.  Neck: Supple, thyroid normal.  Respiratory: Respiratory effort normal, BS equal bilaterally without rales, rhonchi, wheezing or stridor.  Cardio: RRR with no MRGs. Brisk peripheral pulses without edema.  Abdomen: Soft, + BS,  Non tender, no guarding, rebound, hernias, masses. Lymphatics: Non tender without lymphadenopathy.  Musculoskeletal: Full ROM, 5/5 strength, Normal gait Skin: Warm, dry without rashes, lesions, ecchymosis.  Neuro: Cranial nerves intact. Normal muscle tone, no  cerebellar symptoms. Psych: Awake and oriented X 3, normal affect, Insight and Judgment appropriate.    Vicie Mutters, PA-C 6:21 AM [facility]

## 2018-05-12 ENCOUNTER — Ambulatory Visit (INDEPENDENT_AMBULATORY_CARE_PROVIDER_SITE_OTHER): Payer: 59 | Admitting: Physician Assistant

## 2018-05-12 VITALS — BP 112/70 | HR 75 | Temp 98.2°F | Resp 14 | Ht 62.0 in | Wt 140.0 lb

## 2018-05-12 DIAGNOSIS — G4700 Insomnia, unspecified: Secondary | ICD-10-CM | POA: Diagnosis not present

## 2018-05-12 DIAGNOSIS — N926 Irregular menstruation, unspecified: Secondary | ICD-10-CM | POA: Diagnosis not present

## 2018-05-12 DIAGNOSIS — G43109 Migraine with aura, not intractable, without status migrainosus: Secondary | ICD-10-CM

## 2018-05-12 NOTE — Patient Instructions (Addendum)
Vitamin D too high, cut back to 10,000 5 days a week  Get 10 mins exercise a day Try a cooler bed  11 Tips to Follow:  1. No caffeine after 3pm: Avoid beverages with caffeine (soda, tea, energy drinks, etc.) especially after 3pm. 2. Don't go to bed hungry: Have your evening meal at least 3 hrs. before going to sleep. It's fine to have a small bedtime snack such as a glass of milk and a few crackers but don't have a big meal. 3. Have a nightly routine before bed: Plan on "winding down" before you go to sleep. Begin relaxing about 1 hour before you go to bed. Try doing a quiet activity such as listening to calming music, reading a book or meditating. 4. Turn off the TV and ALL electronics including video games, tablets, laptops, etc. 1 hour before sleep, and keep them out of the bedroom. 5. Turn off your cell phone and all notifications (new email and text alerts) or even better, leave your phone outside your room while you sleep. Studies have shown that a part of your brain continues to respond to certain lights and sounds even while you're still asleep. 6. Make your bedroom quiet, dark and cool. If you can't control the noise, try wearing earplugs or using a fan to block out other sounds. 7. Practice relaxation techniques. Try reading a book or meditating or drain your brain by writing a list of what you need to do the next day. 8. Don't nap unless you feel sick: you'll have a better night's sleep. 9. Don't smoke, or quit if you do. Nicotine, alcohol, and marijuana can all keep you awake. Talk to your health care provider if you need help with substance use. 10. Most importantly, wake up at the same time every day (or within 1 hour of your usual wake up time) EVEN on the weekends. A regular wake up time promotes sleep hygiene and prevents sleep problems. 11. Reduce exposure to bright light in the last three hours of the day before going to sleep. Maintaining good sleep hygiene and having good sleep  habits lower your risk of developing sleep problems. Getting better sleep can also improve your concentration and alertness. Try the simple steps in this guide. If you still have trouble getting enough rest, make an appointment with your health care provider.   Menopause Menopause is the normal time of life when menstrual periods stop completely. It is usually confirmed by 12 months without a menstrual period. The transition to menopause (perimenopause) most often happens between the ages of 19 and 58. During perimenopause, hormone levels change in your body, which can cause symptoms and affect your health. Menopause may increase your risk for:  Loss of bone (osteoporosis), which causes bone breaks (fractures).  Depression.  Hardening and narrowing of the arteries (atherosclerosis), which can cause heart attacks and strokes. What are the causes? This condition is usually caused by a natural change in hormone levels that happens as you get older. The condition may also be caused by surgery to remove both ovaries (bilateral oophorectomy). What increases the risk? This condition is more likely to start at an earlier age if you have certain medical conditions or treatments, including:  A tumor of the pituitary gland in the brain.  A disease that affects the ovaries and hormone production.  Radiation treatment for cancer.  Certain cancer treatments, such as chemotherapy or hormone (anti-estrogen) therapy.  Heavy smoking and excessive alcohol use.  Family history  of early menopause. This condition is also more likely to develop earlier in women who are very thin. What are the signs or symptoms? Symptoms of this condition include:  Hot flashes.  Irregular menstrual periods.  Night sweats.  Changes in feelings about sex. This could be a decrease in sex drive or an increased comfort around your sexuality.  Vaginal dryness and thinning of the vaginal walls. This may cause painful  intercourse.  Dryness of the skin and development of wrinkles.  Headaches.  Problems sleeping (insomnia).  Mood swings or irritability.  Memory problems.  Weight gain.  Hair growth on the face and chest.  Bladder infections or problems with urinating. How is this diagnosed? This condition is diagnosed based on your medical history, a physical exam, your age, your menstrual history, and your symptoms. Hormone tests may also be done. How is this treated? In some cases, no treatment is needed. You and your health care provider should make a decision together about whether treatment is necessary. Treatment will be based on your individual condition and preferences. Treatment for this condition focuses on managing symptoms. Treatment may include:  Menopausal hormone therapy (MHT).  Medicines to treat specific symptoms or complications.  Acupuncture.  Vitamin or herbal supplements. Before starting treatment, make sure to let your health care provider know if you have a personal or family history of:  Heart disease.  Breast cancer.  Blood clots.  Diabetes.  Osteoporosis. Follow these instructions at home: Lifestyle  Do not use any products that contain nicotine or tobacco, such as cigarettes and e-cigarettes. If you need help quitting, ask your health care provider.  Get at least 30 minutes of physical activity on 5 or more days each week.  Avoid alcoholic and caffeinated beverages, as well as spicy foods. This may help prevent hot flashes.  Get 7-8 hours of sleep each night.  If you have hot flashes, try: ? Dressing in layers. ? Avoiding things that may trigger hot flashes, such as spicy food, warm places, or stress. ? Taking slow, deep breaths when a hot flash starts. ? Keeping a fan in your home and office.  Find ways to manage stress, such as deep breathing, meditation, or journaling.  Consider going to group therapy with other women who are having menopause  symptoms. Ask your health care provider about recommended group therapy meetings. Eating and drinking  Eat a healthy, balanced diet that contains whole grains, lean protein, low-fat dairy, and plenty of fruits and vegetables.  Your health care provider may recommend adding more soy to your diet. Foods that contain soy include tofu, tempeh, and soy milk.  Eat plenty of foods that contain calcium and vitamin D for bone health. Items that are rich in calcium include low-fat milk, yogurt, beans, almonds, sardines, broccoli, and kale. Medicines  Take over-the-counter and prescription medicines only as told by your health care provider.  Talk with your health care provider before starting any herbal supplements. If prescribed, take vitamins and supplements as told by your health care provider. These may include: ? Calcium. Women age 69 and older should get 1,200 mg (milligrams) of calcium every day. ? Vitamin D. Women need 600-800 International Units of vitamin D each day. ? Vitamins B12 and B6. Aim for 50 micrograms of B12 and 1.5 mg of B6 each day. General instructions  Keep track of your menstrual periods, including: ? When they occur. ? How heavy they are and how long they last. ? How much time passes  between periods.  Keep track of your symptoms, noting when they start, how often you have them, and how long they last.  Use vaginal lubricants or moisturizers to help with vaginal dryness and improve comfort during sex.  Keep all follow-up visits as told by your health care provider. This is important. This includes any group therapy or counseling. Contact a health care provider if:  You are still having menstrual periods after age 30.  You have pain during sex.  You have not had a period for 12 months and you develop vaginal bleeding. Get help right away if:  You have: ? Severe depression. ? Excessive vaginal bleeding. ? Pain when you urinate. ? A fast or irregular heart beat  (palpitations). ? Severe headaches. ? Abdomen (abdominal) pain or severe indigestion.  You fell and you think you have a broken bone.  You develop leg or chest pain.  You develop vision problems.  You feel a lump in your breast. Summary  Menopause is the normal time of life when menstrual periods stop completely. It is usually confirmed by 12 months without a menstrual period.  The transition to menopause (perimenopause) most often happens between the ages of 63 and 30.  Symptoms can be managed through medicines, lifestyle changes, and complementary therapies such as acupuncture.  Eat a balanced diet that is rich in nutrients to promote bone health and heart health and to manage symptoms during menopause. This information is not intended to replace advice given to you by your health care provider. Make sure you discuss any questions you have with your health care provider. Document Released: 06/06/2003 Document Revised: 04/18/2016 Document Reviewed: 04/18/2016 Elsevier Interactive Patient Education  2019 [employer].

## 2018-05-13 LAB — CBC WITH DIFFERENTIAL/PLATELET
Absolute Monocytes: 418 cells/uL (ref 200–950)
Basophils Absolute: 20 cells/uL (ref 0–200)
Basophils Relative: 0.4 %
Eosinophils Absolute: 102 cells/uL (ref 15–500)
Eosinophils Relative: 2 %
HEMATOCRIT: 36 % (ref 35.0–45.0)
Hemoglobin: 12.7 g/dL (ref 11.7–15.5)
Lymphs Abs: 1556 cells/uL (ref 850–3900)
MCH: 32.3 pg (ref 27.0–33.0)
MCHC: 35.3 g/dL (ref 32.0–36.0)
MCV: 91.6 fL (ref 80.0–100.0)
MPV: 10.2 fL (ref 7.5–12.5)
Monocytes Relative: 8.2 %
Neutro Abs: 3004 cells/uL (ref 1500–7800)
Neutrophils Relative %: 58.9 %
Platelets: 266 10*3/uL (ref 140–400)
RBC: 3.93 10*6/uL (ref 3.80–5.10)
RDW: 11.8 % (ref 11.0–15.0)
Total Lymphocyte: 30.5 %
WBC: 5.1 10*3/uL (ref 3.8–10.8)

## 2018-05-13 LAB — TSH: TSH: 2.73 mIU/L

## 2018-05-13 LAB — FOLLICLE STIMULATING HORMONE: FSH: 10.7 m[IU]/mL

## 2018-05-13 LAB — COMPLETE METABOLIC PANEL WITH GFR
AG Ratio: 1.4 (calc) (ref 1.0–2.5)
ALT: 20 U/L (ref 6–29)
AST: 21 U/L (ref 10–35)
Albumin: 4 g/dL (ref 3.6–5.1)
Alkaline phosphatase (APISO): 63 U/L (ref 31–125)
BILIRUBIN TOTAL: 0.9 mg/dL (ref 0.2–1.2)
BUN: 12 mg/dL (ref 7–25)
CO2: 26 mmol/L (ref 20–32)
Calcium: 9.1 mg/dL (ref 8.6–10.2)
Chloride: 104 mmol/L (ref 98–110)
Creat: 0.76 mg/dL (ref 0.50–1.10)
GFR, Est African American: 107 mL/min/{1.73_m2} (ref >=60)
GFR, Est Non African American: 92 mL/min/{1.73_m2} (ref >=60)
Globulin: 2.8 g/dL (calc) (ref 1.9–3.7)
Glucose, Bld: 82 mg/dL (ref 65–99)
Potassium: 3.9 mmol/L (ref 3.5–5.3)
Sodium: 138 mmol/L (ref 135–146)
TOTAL PROTEIN: 6.8 g/dL (ref 6.1–8.1)

## 2018-06-28 ENCOUNTER — Other Ambulatory Visit: Payer: Self-pay | Admitting: Gastroenterology

## 2018-06-28 MED ORDER — PANTOPRAZOLE SODIUM 40 MG PO TBEC: 40.0000 mg | DELAYED_RELEASE_TABLET | Freq: Every day | ORAL | 2 refills | Status: DC

## 2018-06-28 NOTE — Telephone Encounter (Signed)
Pt called needing help with getting this medication refill

## 2018-06-28 NOTE — Addendum Note (Signed)
Addended by: Marzella Schlein on: 06/28/2018 04:15 PM   Modules accepted: Orders

## 2018-06-28 NOTE — Telephone Encounter (Signed)
Prescription for pantoprazole has been sent to the pharmacy per patient's request.

## 2018-07-07 ENCOUNTER — Other Ambulatory Visit: Payer: Self-pay | Admitting: Gynecology

## 2018-07-07 DIAGNOSIS — Z1231 Encounter for screening mammogram for malignant neoplasm of breast: Secondary | ICD-10-CM

## 2018-07-13 ENCOUNTER — Other Ambulatory Visit: Payer: Self-pay | Admitting: Internal Medicine

## 2018-07-13 DIAGNOSIS — G43109 Migraine with aura, not intractable, without status migrainosus: Secondary | ICD-10-CM

## 2018-07-14 ENCOUNTER — Other Ambulatory Visit: Payer: Self-pay | Admitting: Gynecology

## 2018-07-14 NOTE — Telephone Encounter (Signed)
Annual exam on 07/18/18

## 2018-07-15 ENCOUNTER — Encounter: Payer: 59 | Admitting: Gynecology

## 2018-07-15 ENCOUNTER — Other Ambulatory Visit: Payer: Self-pay

## 2018-07-18 ENCOUNTER — Encounter: Payer: Self-pay | Admitting: Gynecology

## 2018-07-18 ENCOUNTER — Ambulatory Visit (INDEPENDENT_AMBULATORY_CARE_PROVIDER_SITE_OTHER): Payer: 59 | Admitting: Gynecology

## 2018-07-18 ENCOUNTER — Other Ambulatory Visit: Payer: Self-pay

## 2018-07-18 VITALS — BP 118/74 | Ht 63.0 in | Wt 138.0 lb

## 2018-07-18 DIAGNOSIS — Z01419 Encounter for gynecological examination (general) (routine) without abnormal findings: Secondary | ICD-10-CM | POA: Diagnosis not present

## 2018-07-18 MED ORDER — NORGESTIM-ETH ESTRAD TRIPHASIC 0.18/0.215/0.25 MG-35 MCG PO TABS: 1.0000 | ORAL_TABLET | Freq: Every day | ORAL | 4 refills | Status: DC

## 2018-07-18 NOTE — Patient Instructions (Signed)
Follow-up for annual exam in 1 year

## 2018-07-18 NOTE — Progress Notes (Signed)
    Tina Holmes 1970/11/27 [id number]        48 y.o.  G2P0011 for annual gynecologic exam.  Without gynecologic complaints  Past medical history,surgical history, problem list, medications, allergies, family history and social history were all reviewed and documented as reviewed in the EPIC chart.  ROS:  Performed with pertinent positives and negatives included in the history, assessment and plan.   Additional significant findings : None   Exam: Caryn Bee assistant Vitals:   07/18/18 1122  BP: 118/74  Weight: 138 lb (62.6 kg)  Height: 5\' 3"  (1.6 m)   Body mass index is 24.45 kg/m.  General appearance:  Normal affect, orientation and appearance. Skin: Grossly normal HEENT: Without gross lesions.  No cervical or supraclavicular adenopathy. Thyroid normal.  Lungs:  Clear without wheezing, rales or rhonchi Cardiac: RR, without RMG Abdominal:  Soft, nontender, without masses, guarding, rebound, organomegaly or hernia Breasts:  Examined lying and sitting without masses, retractions, discharge or axillary adenopathy. Pelvic:  Ext, BUS, Vagina: Normal  Cervix: Normal  Uterus: Anteverted, normal size, shape and contour, midline and mobile nontender   Adnexa: Without masses or tenderness    Anus and perineum: Normal   Rectovaginal: Normal sphincter tone without palpated masses or tenderness.    Assessment/Plan:  48 y.o. G20P0011 female for annual gynecologic exam.  With regular menses, oral contraceptives  1. Continues on Ortho Tri-Cyclen equivalent doing well.  We discussed the risks versus benefits to include increased risk of thrombosis.  Never smoked and not being followed for any medical issues.  At this point she prefers to continue and I refilled her x1 year. 2. Mammography is scheduled in June.  Breast exam normal today.  SBE monthly reviewed. 3. Pap smear 2019.  No Pap smear done today.  No history of abnormal Pap smears.  Plan repeat Pap smear at 3-year interval per  current screening guidelines. 4. Health maintenance.  No routine lab work done as patient reports is done elsewhere.  Follow-up 1 year, sooner as needed   Anastasio Auerbach MD, 11:43 AM 07/18/2018

## 2018-07-18 NOTE — Addendum Note (Signed)
Addended by: Anastasio Auerbach on: 07/18/2018 11:46 AM   Modules accepted: Orders

## 2018-08-22 ENCOUNTER — Encounter: Payer: Self-pay | Admitting: Gastroenterology

## 2018-09-12 ENCOUNTER — Ambulatory Visit: Payer: Self-pay

## 2018-09-23 ENCOUNTER — Ambulatory Visit
Admission: RE | Admit: 2018-09-23 | Discharge: 2018-09-23 | Disposition: A | Discharge status: Home / Self Care | Payer: 59 | Source: Ambulatory Visit | Attending: Gynecology | Admitting: Gynecology

## 2018-09-23 ENCOUNTER — Other Ambulatory Visit: Payer: Self-pay

## 2018-09-23 DIAGNOSIS — Z1231 Encounter for screening mammogram for malignant neoplasm of breast: Secondary | ICD-10-CM

## 2018-09-27 ENCOUNTER — Other Ambulatory Visit: Payer: Self-pay | Admitting: Gynecology

## 2018-09-27 DIAGNOSIS — R928 Other abnormal and inconclusive findings on diagnostic imaging of breast: Secondary | ICD-10-CM

## 2018-09-29 ENCOUNTER — Other Ambulatory Visit: Payer: Self-pay

## 2018-09-29 ENCOUNTER — Ambulatory Visit
Admission: RE | Admit: 2018-09-29 | Discharge: 2018-09-29 | Disposition: A | Discharge status: Home / Self Care | Payer: 59 | Source: Ambulatory Visit | Attending: Gynecology | Admitting: Gynecology

## 2018-09-29 ENCOUNTER — Ambulatory Visit: Payer: 59

## 2018-09-29 DIAGNOSIS — R928 Other abnormal and inconclusive findings on diagnostic imaging of breast: Secondary | ICD-10-CM

## 2018-10-07 ENCOUNTER — Other Ambulatory Visit: Payer: Self-pay | Admitting: Gastroenterology

## 2018-10-24 ENCOUNTER — Encounter: Payer: Self-pay | Admitting: Gastroenterology

## 2018-11-10 ENCOUNTER — Ambulatory Visit (AMBULATORY_SURGERY_CENTER): Payer: Self-pay

## 2018-11-10 ENCOUNTER — Other Ambulatory Visit: Payer: Self-pay

## 2018-11-10 VITALS — Ht 63.0 in | Wt 135.8 lb

## 2018-11-10 DIAGNOSIS — Z1211 Encounter for screening for malignant neoplasm of colon: Secondary | ICD-10-CM

## 2018-11-10 MED ORDER — PEG 3350-KCL-NA BICARB-NACL 420 G PO SOLR: 4000.0000 mL | Freq: Once | ORAL | 0 refills | Status: AC

## 2018-11-10 NOTE — Progress Notes (Signed)
Denies allergies to eggs or soy products. Denies complication of anesthesia or sedation. Denies use of weight loss medication. Denies use of O2.   Emmi instructions given for colonoscopy.  

## 2018-11-16 ENCOUNTER — Encounter: Payer: Self-pay | Admitting: Gastroenterology

## 2018-11-18 ENCOUNTER — Telehealth: Payer: Self-pay | Admitting: Gastroenterology

## 2018-11-18 NOTE — Telephone Encounter (Signed)

## 2018-11-21 ENCOUNTER — Ambulatory Visit (AMBULATORY_SURGERY_CENTER): Payer: 59 | Admitting: Gastroenterology

## 2018-11-21 ENCOUNTER — Other Ambulatory Visit: Payer: Self-pay

## 2018-11-21 ENCOUNTER — Encounter: Payer: Self-pay | Admitting: Gastroenterology

## 2018-11-21 VITALS — BP 119/64 | HR 66 | Temp 97.8°F | Resp 14

## 2018-11-21 DIAGNOSIS — D120 Benign neoplasm of cecum: Secondary | ICD-10-CM

## 2018-11-21 DIAGNOSIS — K633 Ulcer of intestine: Secondary | ICD-10-CM

## 2018-11-21 DIAGNOSIS — K635 Polyp of colon: Secondary | ICD-10-CM | POA: Diagnosis not present

## 2018-11-21 DIAGNOSIS — Z1211 Encounter for screening for malignant neoplasm of colon: Secondary | ICD-10-CM

## 2018-11-21 MED ORDER — SODIUM CHLORIDE 0.9 % IV SOLN: 500.0000 mL | Freq: Once | INTRAVENOUS | Status: DC

## 2018-11-21 NOTE — Progress Notes (Signed)
Report to PACU, RN, vss, BBS= Clear.  

## 2018-11-21 NOTE — Patient Instructions (Signed)
YOU HAD AN ENDOSCOPIC PROCEDURE TODAY AT THE [facility]:   Refer to the procedure report that was given to you for any specific questions about what was found during the examination.  If the procedure report does not answer your questions, please call your gastroenterologist to clarify.  If you requested that your care partner not be given the details of your procedure findings, then the procedure report has been included in a sealed envelope for you to review at your convenience later.  YOU SHOULD EXPECT: Some feelings of bloating in the abdomen. Passage of more gas than usual.  Walking can help get rid of the air that was put into your GI tract during the procedure and reduce the bloating. If you had a lower endoscopy (such as a colonoscopy or flexible sigmoidoscopy) you may notice spotting of blood in your stool or on the toilet paper. If you underwent a bowel prep for your procedure, you may not have a normal bowel movement for a few days.  Please Note:  You might notice some irritation and congestion in your nose or some drainage.  This is from the oxygen used during your procedure.  There is no need for concern and it should clear up in a day or so.  SYMPTOMS TO REPORT IMMEDIATELY:   Following lower endoscopy (colonoscopy or flexible sigmoidoscopy):  Excessive amounts of blood in the stool  Significant tenderness or worsening of abdominal pains  Swelling of the abdomen that is new, acute  Fever of 100F or higher  For urgent or emergent issues, a gastroenterologist can be reached at any hour by calling [phone].   DIET:  We do recommend a small meal at first, but then you may proceed to your regular diet.  Drink plenty of fluids but you should avoid alcoholic beverages for 24 hours.  ACTIVITY:  You should plan to take it easy for the rest of today and you should NOT DRIVE or use heavy machinery until tomorrow (because of the sedation medicines used during the test).     FOLLOW UP: Our staff will call the number listed on your records 48-72 hours following your procedure to check on you and address any questions or concerns that you may have regarding the information given to you following your procedure. If we do not reach you, we will leave a message.  We will attempt to reach you two times.  During this call, we will ask if you have developed any symptoms of COVID 19. If you develop any symptoms (ie: fever, flu-like symptoms, shortness of breath, cough etc.) before then, please call [phone].  If you test positive for Covid 19 in the 2 weeks post procedure, please call and report this information to us.    If any biopsies were taken you will be contacted by phone or by letter within the next 1-3 weeks.  Please call us at [phone] if you have not heard about the biopsies in 3 weeks.    SIGNATURES/CONFIDENTIALITY: You and/or your care partner have signed paperwork which will be entered into your electronic medical record.  These signatures attest to the fact that that the information above on your After Visit Summary has been reviewed and is understood.  Full responsibility of the confidentiality of this discharge information lies with you and/or your care-partner. 

## 2018-11-21 NOTE — Progress Notes (Signed)
cw vitals   JB temp  No egg or soy allergy   I have reviewed the patient's medical history in detail and updated the computerized patient record.

## 2018-11-21 NOTE — Op Note (Signed)
[facility] Patient Name: Tina Holmes Procedure Date: 11/21/2018 8:10 AM MRN: [id number] Endoscopist: Milus Banister , MD Age: 48 Referring MD:  Date of Birth: [birth year] Gender: Female Account #: [id number] Procedure:                Colonoscopy Indications:              Screening for colorectal malignant neoplasm Medicines:                Monitored Anesthesia Care Procedure:                Pre-Anesthesia Assessment:                           - Prior to the procedure, a History and Physical                            was performed, and patient medications and                            allergies were reviewed. The patient's tolerance of                            previous anesthesia was also reviewed. The risks                            and benefits of the procedure and the sedation                            options and risks were discussed with the patient.                            All questions were answered, and informed consent                            was obtained. Prior Anticoagulants: The patient has                            taken no previous anticoagulant or antiplatelet                            agents. ASA Grade Assessment: II - A patient with                            mild systemic disease. After reviewing the risks                            and benefits, the patient was deemed in                            satisfactory condition to undergo the procedure.                           After obtaining informed consent, the colonoscope  was passed under direct vision. Throughout the                            procedure, the patient's blood pressure, pulse, and                            oxygen saturations were monitored continuously. The                            Colonoscope was introduced through the anus and                            advanced to the the cecum, identified by                            appendiceal orifice and  ileocecal valve. The                            colonoscopy was performed without difficulty. The                            patient tolerated the procedure well. The quality                            of the bowel preparation was excellent. Scope In: 8:12:55 AM Scope Out: 8:23:53 AM Scope Withdrawal Time: 0 hours 8 minutes 43 seconds  Total Procedure Duration: 0 hours 10 minutes 58 seconds  Findings:                 Large erosion (6-62mm across) vs shallow ulcer on                            distal end of the IC valve (NSAID effect, IBD,                            neoplasm?), biopsied.                           The exam was otherwise without abnormality on                            direct and retroflexion views. Complications:            No immediate complications. Estimated blood loss:                            None. Estimated Blood Loss:     Estimated blood loss: none. Impression:               - Large erosion (6-48mm across) vs shallow ulcer on                            distal end of the IC valve (NSAID effect, IBD,  neoplasm?), biopsied.                           - The examination was otherwise normal on direct                            and retroflexion views. Recommendation:           - Patient has a contact number available for                            emergencies. The signs and symptoms of potential                            delayed complications were discussed with the                            patient. Return to normal activities tomorrow.                            Written discharge instructions were provided to the                            patient.                           - Resume previous diet.                           - Continue present medications.                           - Repeat colonoscopy is recommended. The                            colonoscopy date will be determined after pathology                            results from  today's exam become available for                            review. Milus Banister, MD 11/21/2018 8:27:20 AM This report has been signed electronically.

## 2018-11-21 NOTE — Progress Notes (Signed)
Called to room to assist during endoscopic procedure.  Patient ID and intended procedure confirmed with present staff. Received instructions for my participation in the procedure from the performing physician.  

## 2018-11-23 ENCOUNTER — Telehealth: Payer: Self-pay

## 2018-11-23 ENCOUNTER — Telehealth: Payer: Self-pay | Admitting: *Deleted

## 2018-11-23 NOTE — Telephone Encounter (Signed)
No answer, left message to call later, B.Dawnelle Warman RN

## 2018-11-23 NOTE — Telephone Encounter (Signed)
  Follow up Call-  Call back number 11/21/2018  Post procedure Call Back phone  # [phone]  Permission to leave phone message Yes  Some recent data might be hidden     Patient questions:  Do you have a fever, pain , or abdominal swelling? No. Pain Score  0 *  Have you tolerated food without any problems? Yes.    Have you been able to return to your normal activities? Yes.    Do you have any questions about your discharge instructions: Diet   No. Medications  No. Follow up visit  No.  Do you have questions or concerns about your Care? No.  Actions: * If pain score is 4 or above: No action needed, pain <4.  1. Have you developed a fever since your procedure? no  2.   Have you had an respiratory symptoms (SOB or cough) since your procedure? no  3.   Have you tested positive for COVID 19 since your procedure no  4.   Have you had any family members/close contacts diagnosed with the COVID 19 since your procedure?  no   If yes to any of these questions please route to Joylene John, RN and Alphonsa Gin, Therapist, sports.

## 2018-12-01 ENCOUNTER — Encounter: Payer: Self-pay | Admitting: Internal Medicine

## 2018-12-01 ENCOUNTER — Other Ambulatory Visit: Payer: Self-pay

## 2018-12-01 ENCOUNTER — Ambulatory Visit (INDEPENDENT_AMBULATORY_CARE_PROVIDER_SITE_OTHER): Payer: 59 | Admitting: Internal Medicine

## 2018-12-01 VITALS — BP 104/66 | HR 80 | Temp 97.6°F | Resp 16 | Ht 63.0 in | Wt 134.6 lb

## 2018-12-01 DIAGNOSIS — R7309 Other abnormal glucose: Secondary | ICD-10-CM

## 2018-12-01 DIAGNOSIS — Z111 Encounter for screening for respiratory tuberculosis: Secondary | ICD-10-CM

## 2018-12-01 DIAGNOSIS — Z87891 Personal history of nicotine dependence: Secondary | ICD-10-CM | POA: Diagnosis not present

## 2018-12-01 DIAGNOSIS — Z136 Encounter for screening for cardiovascular disorders: Secondary | ICD-10-CM

## 2018-12-01 DIAGNOSIS — E559 Vitamin D deficiency, unspecified: Secondary | ICD-10-CM

## 2018-12-01 DIAGNOSIS — R7303 Prediabetes: Secondary | ICD-10-CM

## 2018-12-01 DIAGNOSIS — Z1211 Encounter for screening for malignant neoplasm of colon: Secondary | ICD-10-CM

## 2018-12-01 DIAGNOSIS — K219 Gastro-esophageal reflux disease without esophagitis: Secondary | ICD-10-CM

## 2018-12-01 DIAGNOSIS — Z0000 Encounter for general adult medical examination without abnormal findings: Secondary | ICD-10-CM | POA: Diagnosis not present

## 2018-12-01 DIAGNOSIS — E785 Hyperlipidemia, unspecified: Secondary | ICD-10-CM

## 2018-12-01 DIAGNOSIS — Z8249 Family history of ischemic heart disease and other diseases of the circulatory system: Secondary | ICD-10-CM | POA: Diagnosis not present

## 2018-12-01 DIAGNOSIS — Z0001 Encounter for general adult medical examination with abnormal findings: Secondary | ICD-10-CM

## 2018-12-01 DIAGNOSIS — Z79899 Other long term (current) drug therapy: Secondary | ICD-10-CM

## 2018-12-01 DIAGNOSIS — R5383 Other fatigue: Secondary | ICD-10-CM

## 2018-12-01 DIAGNOSIS — R030 Elevated blood-pressure reading, without diagnosis of hypertension: Secondary | ICD-10-CM

## 2018-12-01 NOTE — Patient Instructions (Signed)

## 2018-12-01 NOTE — Progress Notes (Signed)
Annual Screening/Preventative Visit & Comprehensive Evaluation &  Examination     This very nice 48 y.o. MWF  presents for a Screening /Preventative Visit & comprehensive evaluation and management of multiple medical co-morbidities.  Patient has been followed for HTN, HLD, Prediabetes  and Vitamin D Deficiency.      Patient has been followed expectantly for labile HTN.  Patient's BP has been controlled at home and patient denies any cardiac symptoms as chest pain, palpitations, shortness of breath, dizziness or ankle swelling. Today's BP is at goal -104/66.      Patient's hyperlipidemia is controlled with diet and Colestid (taking for chronic diarrhea). Patient denies myalgias or other medication SE's. Last lipids were at goal: Lab Results  Component Value Date   CHOL 177 11/02/2017   HDL 65 11/02/2017   LDLCALC 91 11/02/2017   TRIG 115 11/02/2017   CHOLHDL 2.7 11/02/2017      Patient has hx/o prediabetes (A1c 5.8% / 2012 & 5.7% / 2017)  and patient denies reactive hypoglycemic symptoms, visual blurring, diabetic polys or paresthesias. Last A1c was Normal & at goal: Lab Results  Component Value Date   HGBA1C 5.5 11/02/2017      Finally, patient has history of Vitamin D Deficiency ("40" on Tx / 2009)  and last Vitamin D was elevated & dose was tapered: Lab Results  Component Value Date   VD25OH 124 (H) 11/02/2017   Current Outpatient Medications on File Prior to Visit  Medication Sig  . aspirin EC 81 MG tablet Take 81 mg by mouth daily.  . Cholecalciferol (VITAMIN D PO) Take 6,000 Units by mouth.    . colestipol (COLESTID) 1 g tablet Take 1 tablet (1 g total) by mouth daily.  . Loratadine (CLARITIN PO) Take 1 tablet by mouth daily as needed.   . Norgestimate-Ethinyl Estradiol Triphasic (TRI-PREVIFEM) 0.18/0.215/0.25 MG-35 MCG tablet Take 1 tablet by mouth daily.  . pantoprazole (PROTONIX) 40 MG tablet TAKE 1 TABLET BY MOUTH EVERY DAY   No current facility-administered medications  on file prior to visit.    Allergies  Allergen Reactions  . Imitrex [Sumatriptan]     Throat swelling  . Viberzi [Eluxadoline]     Throat swelling   . Celexa [Citalopram]     Decreased libido  . Codeine     N/V  . Topamax [Topiramate]     Neurological changes  . Zoloft [Sertraline Hcl]     N/V   Past Medical History:  Diagnosis Date  . Allergy   . Anemia   . Anxiety   . Depression   . GERD (gastroesophageal reflux disease)   . IBS (irritable bowel syndrome)   . Migraines   . Restless leg syndrome    Health Maintenance  Topic Date Due  . INFLUENZA VACCINE  10/29/2018  . PAP SMEAR-Modifier  07/13/2020  . MAMMOGRAM  09/22/2020  . TETANUS/TDAP  07/21/2026  . COLONOSCOPY  11/20/2028  . HIV Screening  Completed   Immunization History  Administered Date(s) Administered  . Influenza Split 01/14/2011, 02/16/2013, 01/10/2015  . Influenza-Unspecified 02/11/2014  . PPD Test 08/10/2013, 08/14/2014, 08/15/2015, 10/05/2016, 11/02/2017  . Pneumococcal-Unspecified 07/29/2010  . Td 03/31/2003  . Tdap 08/14/2014, 07/20/2016   Last Colon - 11/21/2018 Dr Ardis Hughs  - (+) polyp and plans 3 month  repeat Colon on 01/06/2019  Last MGM - 09/23/2018  Past Surgical History:  Procedure Laterality Date  . CHOLECYSTECTOMY    . TERMINATION OF PREGNANC     18 WEEKS--DOWNS SYNDROME  Family History  Problem Relation Age of Onset  . Diabetes Mother   . Hypertension Mother   . Heart attack Mother   . Hypertension Father   . CVA Maternal Grandfather   . Colon cancer Neg Hx   . Esophageal cancer Neg Hx   . Stomach cancer Neg Hx   . Rectal cancer Neg Hx    Social History   Tobacco Use  . Smoking status: Former Smoker    Quit date: 03/30/1996    Years since quitting: 22.6  . Smokeless tobacco: Never Used  Substance Use Topics  . Alcohol use: Yes    Alcohol/week: 0.0 standard drinks    Comment: OCC  . Drug use: No    ROS Constitutional: Denies fever, chills, weight loss/gain,  headaches, insomnia,  night sweats, and change in appetite. Does c/o fatigue. Eyes: Denies redness, blurred vision, diplopia, discharge, itchy, watery eyes.  ENT: Denies discharge, congestion, post nasal drip, epistaxis, sore throat, earache, hearing loss, dental pain, Tinnitus, Vertigo, Sinus pain, snoring.  Cardio: Denies chest pain, palpitations, irregular heartbeat, syncope, dyspnea, diaphoresis, orthopnea, PND, claudication, edema Respiratory: denies cough, dyspnea, DOE, pleurisy, hoarseness, laryngitis, wheezing.  Gastrointestinal: Denies dysphagia, heartburn, reflux, water brash, pain, cramps, nausea, vomiting, bloating, diarrhea, constipation, hematemesis, melena, hematochezia, jaundice, hemorrhoids Genitourinary: Denies dysuria, frequency, urgency, nocturia, hesitancy, discharge, hematuria, flank pain Breast: Breast lumps, nipple discharge, bleeding.  Musculoskeletal: Denies arthralgia, myalgia, stiffness, Jt. Swelling, pain, limp, and strain/sprain. Denies falls. Skin: Denies puritis, rash, hives, warts, acne, eczema, changing in skin lesion Neuro: No weakness, tremor, incoordination, spasms, paresthesia, pain Psychiatric: Denies confusion, memory loss, sensory loss. Denies Depression. Endocrine: Denies change in weight, skin, hair change, nocturia, and paresthesia, diabetic polys, visual blurring, hyper / hypo glycemic episodes.  Heme/Lymph: No excessive bleeding, bruising, enlarged lymph nodes.  Physical Exam  BP 104/66   Pulse 80   Temp 97.6 F (36.4 C)   Resp 16   Ht 5\' 3"  (1.6 m)   Wt 134 lb 9.6 oz (61.1 kg)   LMP 11/10/2018   BMI 23.84 kg/m   General Appearance: Well nourished, well groomed and in no apparent distress.  Eyes: PERRLA, EOMs, conjunctiva no swelling or erythema, normal fundi and vessels. Sinuses: No frontal/maxillary tenderness ENT/Mouth: EACs patent / TMs  nl. Nares clear without erythema, swelling, mucoid exudates. Oral hygiene is good. No erythema,  swelling, or exudate. Tongue normal, non-obstructing. Tonsils not swollen or erythematous. Hearing normal.  Neck: Supple, thyroid not palpable. No bruits, nodes or JVD. Respiratory: Respiratory effort normal.  BS equal and clear bilateral without rales, rhonci, wheezing or stridor. Cardio: Heart sounds are normal with regular rate and rhythm and no murmurs, rubs or gallops. Peripheral pulses are normal and equal bilaterally without edema. No aortic or femoral bruits. Chest: symmetric with normal excursions and percussion. Breasts: Symmetric, without lumps, nipple discharge, retractions, or fibrocystic changes.  Abdomen: Flat, soft with bowel sounds active. Nontender, no guarding, rebound, hernias, masses, or organomegaly.  Lymphatics: Non tender without lymphadenopathy.  Genitourinary:  Musculoskeletal: Full ROM all peripheral extremities, joint stability, 5/5 strength, and normal gait. Skin: Warm and dry without rashes, lesions, cyanosis, clubbing or  ecchymosis.  Neuro: Cranial nerves intact, reflexes equal bilaterally. Normal muscle tone, no cerebellar symptoms. Sensation intact.  Pysch: Alert and oriented X 3, normal affect, Insight and Judgment appropriate.   Assessment and Plan  1. Annual Preventative Screening Examination  2. Elevated BP without diagnosis of hypertension  - EKG 12-Lead - [place], RETROPERITNL ABD,  LTD - Urinalysis, Routine w reflex microscopic - Microalbumin / creatinine urine ratio - CBC with Differential/Platelet - COMPLETE METABOLIC PANEL WITH GFR - Magnesium - TSH  3. Elevated lipids  - EKG 12-Lead - [place], RETROPERITNL ABD,  LTD - Lipid panel - TSH  4. Abnormal glucose  - EKG 12-Lead - [place], RETROPERITNL ABD,  LTD - Hemoglobin A1c - Insulin, random  5. Vitamin D deficiency  - VITAMIN D 25 Hydroxyl  6. Prediabetes  - EKG 12-Lead - [place], RETROPERITNL ABD,  LTD  7. Gastroesophageal reflux disease  - CBC with Differential/Platelet  8. Screening  examination for pulmonary tuberculosis  - TB Skin Test  9. Screening for colorectal cancer  - POC Hemoccult Bld/Stl  10. Screening for ischemic heart disease  - EKG 12-Lead  11. FHx: heart disease  - EKG 12-Lead - [place], RETROPERITNL ABD,  LTD  12. Former smoker  - EKG 12-Lead - [place], RETROPERITNL ABD,  LTD  13. Screening for AAA (aortic abdominal aneurysm)  - [place], RETROPERITNL ABD,  LTD  14. Fatigue, unspecified type  - Iron,Total/Total Iron Binding Cap - Vitamin B12 - CBC with Differential/Platelet - TSH  15. Medication management  - Urinalysis, Routine w reflex microscopic - Microalbumin / creatinine urine ratio - CBC with Differential/Platelet - COMPLETE METABOLIC PANEL WITH GFR - Magnesium - Lipid panel - TSH - Hemoglobin A1c - Insulin, random - VITAMIN D 25 Hydroxyl        achieve/maintain BMI less than 25 for weight control, BP monitoring, regular exercise and medications. Discussed med's effects and SE's. Screening labs and tests as requested with regular follow-up as recommended. Over 40 minutes of exam, counseling, chart review and high complex critical decision making was performed.   Kirtland Bouchard, MD

## 2018-12-02 LAB — CBC WITH DIFFERENTIAL/PLATELET
Absolute Monocytes: 461 cells/uL (ref 200–950)
Basophils Absolute: 22 cells/uL (ref 0–200)
Basophils Relative: 0.3 %
Eosinophils Absolute: 108 cells/uL (ref 15–500)
Eosinophils Relative: 1.5 %
HCT: 37 % (ref 35.0–45.0)
Hemoglobin: 12.5 g/dL (ref 11.7–15.5)
Lymphs Abs: 1346 cells/uL (ref 850–3900)
MCH: 31.5 pg (ref 27.0–33.0)
MCHC: 33.8 g/dL (ref 32.0–36.0)
MCV: 93.2 fL (ref 80.0–100.0)
MPV: 10.2 fL (ref 7.5–12.5)
Monocytes Relative: 6.4 %
Neutro Abs: 5263 cells/uL (ref 1500–7800)
Neutrophils Relative %: 73.1 %
Platelets: 285 10*3/uL (ref 140–400)
RBC: 3.97 10*6/uL (ref 3.80–5.10)
RDW: 11.5 % (ref 11.0–15.0)
Total Lymphocyte: 18.7 %
WBC: 7.2 10*3/uL (ref 3.8–10.8)

## 2018-12-02 LAB — LIPID PANEL
Cholesterol: 185 mg/dL (ref <200)
HDL: 72 mg/dL (ref >=50)
LDL Cholesterol (Calc): 92 mg/dL (calc)
Non-HDL Cholesterol (Calc): 113 mg/dL (calc) (ref <130)
Total CHOL/HDL Ratio: 2.6 (calc) (ref <5.0)
Triglycerides: 118 mg/dL (ref <150)

## 2018-12-02 LAB — VITAMIN D 25 HYDROXY (VIT D DEFICIENCY, FRACTURES): Vit D, 25-Hydroxy: 94 ng/mL (ref 30–100)

## 2018-12-02 LAB — URINALYSIS, ROUTINE W REFLEX MICROSCOPIC
Bilirubin Urine: NEGATIVE
Glucose, UA: NEGATIVE
Hgb urine dipstick: NEGATIVE
Ketones, ur: NEGATIVE
Leukocytes,Ua: NEGATIVE
Nitrite: NEGATIVE
Protein, ur: NEGATIVE
Specific Gravity, Urine: 1.019 (ref 1.001–1.03)
pH: 6 (ref 5.0–8.0)

## 2018-12-02 LAB — COMPLETE METABOLIC PANEL WITH GFR
AG Ratio: 1.5 (calc) (ref 1.0–2.5)
ALT: 13 U/L (ref 6–29)
AST: 17 U/L (ref 10–35)
Albumin: 4 g/dL (ref 3.6–5.1)
Alkaline phosphatase (APISO): 66 U/L (ref 37–153)
BUN: 12 mg/dL (ref 7–25)
CO2: 28 mmol/L (ref 20–32)
Calcium: 9.1 mg/dL (ref 8.6–10.4)
Chloride: 104 mmol/L (ref 98–110)
Creat: 0.72 mg/dL (ref 0.50–1.05)
GFR, Est African American: 113 mL/min/{1.73_m2} (ref >=60)
GFR, Est Non African American: 98 mL/min/{1.73_m2} (ref >=60)
Globulin: 2.7 g/dL (calc) (ref 1.9–3.7)
Glucose, Bld: 112 mg/dL — ABNORMAL HIGH (ref 65–99)
Potassium: 4.6 mmol/L (ref 3.5–5.3)
Sodium: 141 mmol/L (ref 135–146)
Total Bilirubin: 0.6 mg/dL (ref 0.2–1.2)
Total Protein: 6.7 g/dL (ref 6.1–8.1)

## 2018-12-02 LAB — HEMOGLOBIN A1C
Hgb A1c MFr Bld: 5.5 % of total Hgb (ref <5.7)
Mean Plasma Glucose: 111 (calc)
eAG (mmol/L): 6.2 (calc)

## 2018-12-02 LAB — MICROALBUMIN / CREATININE URINE RATIO
Creatinine, Urine: 102 mg/dL (ref 20–275)
Microalb Creat Ratio: 3 mcg/mg creat (ref <30)
Microalb, Ur: 0.3 mg/dL

## 2018-12-02 LAB — IRON, TOTAL/TOTAL IRON BINDING CAP
%SAT: 18 % (calc) (ref 16–45)
Iron: 62 ug/dL (ref 45–160)
TIBC: 350 mcg/dL (calc) (ref 250–450)

## 2018-12-02 LAB — INSULIN, RANDOM: Insulin: 15.3 u[IU]/mL

## 2018-12-02 LAB — TSH: TSH: 1.86 mIU/L

## 2018-12-02 LAB — VITAMIN B12: Vitamin B-12: 249 pg/mL (ref 200–1100)

## 2018-12-02 LAB — MAGNESIUM: Magnesium: 1.6 mg/dL (ref 1.5–2.5)

## 2018-12-04 ENCOUNTER — Encounter: Payer: Self-pay | Admitting: Internal Medicine

## 2018-12-08 LAB — TB SKIN TEST
Induration: 0 mm
TB Skin Test: NEGATIVE

## 2018-12-21 ENCOUNTER — Encounter: Payer: Self-pay | Admitting: Gynecology

## 2018-12-27 ENCOUNTER — Ambulatory Visit (AMBULATORY_SURGERY_CENTER): Payer: Self-pay

## 2018-12-27 ENCOUNTER — Encounter: Payer: Self-pay | Admitting: Gastroenterology

## 2018-12-27 ENCOUNTER — Other Ambulatory Visit: Payer: Self-pay

## 2018-12-27 VITALS — Ht 62.0 in | Wt 136.8 lb

## 2018-12-27 DIAGNOSIS — Z86010 Personal history of colonic polyps: Secondary | ICD-10-CM

## 2018-12-27 MED ORDER — PEG 3350-KCL-NA BICARB-NACL 420 G PO SOLR: 4000.0000 mL | Freq: Once | ORAL | 0 refills | Status: AC

## 2018-12-27 NOTE — Progress Notes (Signed)
Denies allergies to eggs or soy products. Denies complication of anesthesia or sedation. Denies use of weight loss medication. Denies use of O2.   Emmi instructions given for colonoscopy.  

## 2018-12-29 ENCOUNTER — Telehealth: Payer: Self-pay | Admitting: *Deleted

## 2018-12-29 ENCOUNTER — Other Ambulatory Visit: Payer: Self-pay | Admitting: Internal Medicine

## 2018-12-29 MED ORDER — PROPRANOLOL HCL ER 80 MG PO CP24: ORAL_CAPSULE | ORAL | 1 refills | Status: DC

## 2018-12-29 NOTE — Telephone Encounter (Signed)
Patient called and reports she is having an aura that does not lead to a migraine, but causes her head to feel sore. Per Dr Melford Aase, an RX for Propranolol 80 mg ER was sent to her pharmacy, and she was advised to take 1 tablet every day to prevent headaches, not PRN.

## 2019-01-05 ENCOUNTER — Telehealth: Payer: Self-pay

## 2019-01-05 NOTE — Telephone Encounter (Signed)
Covid-19 screening questions   Do you now or have you had a fever in the last 14 days?  Do you have any respiratory symptoms of shortness of breath or cough now or in the last 14 days?  Do you have any family members or close contacts with diagnosed or suspected Covid-19 in the past 14 days?  Have you been tested for Covid-19 and found to be positive?       

## 2019-01-05 NOTE — Telephone Encounter (Signed)
Pt responded "no" to all questions.  °

## 2019-01-06 ENCOUNTER — Other Ambulatory Visit: Payer: Self-pay | Admitting: Gastroenterology

## 2019-01-06 ENCOUNTER — Encounter: Payer: Self-pay | Admitting: Gastroenterology

## 2019-01-06 ENCOUNTER — Ambulatory Visit (AMBULATORY_SURGERY_CENTER): Payer: 59 | Admitting: Gastroenterology

## 2019-01-06 ENCOUNTER — Other Ambulatory Visit: Payer: Self-pay

## 2019-01-06 VITALS — BP 106/63 | HR 65 | Temp 98.7°F | Resp 17 | Ht 62.0 in | Wt 136.8 lb

## 2019-01-06 DIAGNOSIS — K635 Polyp of colon: Secondary | ICD-10-CM

## 2019-01-06 DIAGNOSIS — Z86010 Personal history of colonic polyps: Secondary | ICD-10-CM

## 2019-01-06 DIAGNOSIS — D126 Benign neoplasm of colon, unspecified: Secondary | ICD-10-CM

## 2019-01-06 DIAGNOSIS — D125 Benign neoplasm of sigmoid colon: Secondary | ICD-10-CM

## 2019-01-06 MED ORDER — SODIUM CHLORIDE 0.9 % IV SOLN: 500.0000 mL | Freq: Once | INTRAVENOUS | Status: DC

## 2019-01-06 NOTE — Progress Notes (Signed)
Pt's states no medical or surgical changes since previsit or office visit.   SM vitals, AG IV and JB temps.

## 2019-01-06 NOTE — Progress Notes (Signed)
Called to room to assist during endoscopic procedure.  Patient ID and intended procedure confirmed with present staff. Received instructions for my participation in the procedure from the performing physician.  

## 2019-01-06 NOTE — Op Note (Signed)
[facility] Patient Name: Tina Holmes Procedure Date: 01/06/2019 8:24 AM MRN: [id number] Endoscopist: Milus Banister , MD Age: 47 Referring MD:  Date of Birth: 06-22-1970 Gender: Female Account #: [id number] Procedure:                Colonoscopy Indications:              High risk colon cancer surveillance: Personal                            history of colonic polyps; 10/2018 colonoscopy for                            routine screening found a 6-75mm IC valve erosion,                            forcep biopsy path suggested SSP Medicines:                Monitored Anesthesia Care Procedure:                Pre-Anesthesia Assessment:                           - Prior to the procedure, a History and Physical                            was performed, and patient medications and                            allergies were reviewed. The patient's tolerance of                            previous anesthesia was also reviewed. The risks                            and benefits of the procedure and the sedation                            options and risks were discussed with the patient.                            All questions were answered, and informed consent                            was obtained. Prior Anticoagulants: The patient has                            taken no previous anticoagulant or antiplatelet                            agents. ASA Grade Assessment: II - A patient with                            mild systemic disease. After reviewing the risks  and benefits, the patient was deemed in                            satisfactory condition to undergo the procedure.                           After obtaining informed consent, the colonoscope                            was passed under direct vision. Throughout the                            procedure, the patient's blood pressure, pulse, and                            oxygen saturations were  monitored continuously. The                            Colonoscope was introduced through the anus and                            advanced to the the cecum, identified by                            appendiceal orifice and ileocecal valve. The                            colonoscopy was performed without difficulty. The                            patient tolerated the procedure well. The quality                            of the bowel preparation was good. The ileocecal                            valve, appendiceal orifice, and rectum were                            photographed. Scope In: 8:28:06 AM Scope Out: 8:43:09 AM Scope Withdrawal Time: 0 hours 12 minutes 8 seconds  Total Procedure Duration: 0 hours 15 minutes 3 seconds  Findings:                 The site of 10/2018 IC valve 6-81mm erosion was                            evident, clearly healed. There is no remaining                            erosion, the mucosa appears slightly edematous (not                            neoplastic), biopsies taken with forceps.  Two sessile polyps were found in the sigmoid colon.                            The polyps were 1 to 2 mm in size. These polyps                            were removed with a cold snare. Resection and                            retrieval were complete.                           The exam was otherwise without abnormality on                            direct and retroflexion views. Complications:            No immediate complications. Estimated blood loss:                            None. Estimated Blood Loss:     Estimated blood loss: none. Impression:               - The site of 10/2018 IC valve 6-30mm erosion was                            evident, clearly healed. There is no remaining                            erosion, the mucosa appears slightly edematous (not                            neoplastic), biopsies taken with forceps.                            - Two 1 to 2 mm polyps in the sigmoid colon,                            removed with a cold snare. Resected and retrieved.                           - The examination was otherwise normal on direct                            and retroflexion views. Recommendation:           - Patient has a contact number available for                            emergencies. The signs and symptoms of potential                            delayed complications were discussed with the  patient. Return to normal activities tomorrow.                            Written discharge instructions were provided to the                            patient.                           - Resume previous diet.                           - Continue present medications.                           - Await pathology results. Milus Banister, MD 01/06/2019 8:48:38 AM This report has been signed electronically.

## 2019-01-06 NOTE — Progress Notes (Signed)
Report to PACU, RN, vss, BBS= Clear.  

## 2019-01-06 NOTE — Patient Instructions (Signed)
Handouts Provided:  Polyps  YOU HAD AN ENDOSCOPIC PROCEDURE TODAY AT THE [facility]:   Refer to the procedure report that was given to you for any specific questions about what was found during the examination.  If the procedure report does not answer your questions, please call your gastroenterologist to clarify.  If you requested that your care partner not be given the details of your procedure findings, then the procedure report has been included in a sealed envelope for you to review at your convenience later.  YOU SHOULD EXPECT: Some feelings of bloating in the abdomen. Passage of more gas than usual.  Walking can help get rid of the air that was put into your GI tract during the procedure and reduce the bloating. If you had a lower endoscopy (such as a colonoscopy or flexible sigmoidoscopy) you may notice spotting of blood in your stool or on the toilet paper. If you underwent a bowel prep for your procedure, you may not have a normal bowel movement for a few days.  Please Note:  You might notice some irritation and congestion in your nose or some drainage.  This is from the oxygen used during your procedure.  There is no need for concern and it should clear up in a day or so.  SYMPTOMS TO REPORT IMMEDIATELY:   Following lower endoscopy (colonoscopy or flexible sigmoidoscopy):  Excessive amounts of blood in the stool  Significant tenderness or worsening of abdominal pains  Swelling of the abdomen that is new, acute  Fever of 100F or higher  For urgent or emergent issues, a gastroenterologist can be reached at any hour by calling [phone].   DIET:  We do recommend a small meal at first, but then you may proceed to your regular diet.  Drink plenty of fluids but you should avoid alcoholic beverages for 24 hours.  ACTIVITY:  You should plan to take it easy for the rest of today and you should NOT DRIVE or use heavy machinery until tomorrow (because of the sedation  medicines used during the test).    FOLLOW UP: Our staff will call the number listed on your records 48-72 hours following your procedure to check on you and address any questions or concerns that you may have regarding the information given to you following your procedure. If we do not reach you, we will leave a message.  We will attempt to reach you two times.  During this call, we will ask if you have developed any symptoms of COVID 19. If you develop any symptoms (ie: fever, flu-like symptoms, shortness of breath, cough etc.) before then, please call [phone].  If you test positive for Covid 19 in the 2 weeks post procedure, please call and report this information to us.    If any biopsies were taken you will be contacted by phone or by letter within the next 1-3 weeks.  Please call us at [phone] if you have not heard about the biopsies in 3 weeks.    SIGNATURES/CONFIDENTIALITY: You and/or your care partner have signed paperwork which will be entered into your electronic medical record.  These signatures attest to the fact that that the information above on your After Visit Summary has been reviewed and is understood.  Full responsibility of the confidentiality of this discharge information lies with you and/or your care-partner.  

## 2019-01-10 ENCOUNTER — Telehealth: Payer: Self-pay

## 2019-01-10 NOTE — Telephone Encounter (Signed)
  Follow up Call-  Call back number 01/06/2019 11/21/2018  Post procedure Call Back phone  # [phone]  Permission to leave phone message Yes Yes  Some recent data might be hidden     Patient questions:  Do you have a fever, pain , or abdominal swelling? No. Pain Score  0 *  Have you tolerated food without any problems? Yes.    Have you been able to return to your normal activities? Yes.    Do you have any questions about your discharge instructions: Diet   No. Medications  No. Follow up visit  No.  Do you have questions or concerns about your Care? No.  Actions: * If pain score is 4 or above: No action needed, pain <4. 1. Have you developed a fever since your procedure? no  2.   Have you had an respiratory symptoms (SOB or cough) since your procedure? no  3.   Have you tested positive for COVID 19 since your procedure no  4.   Have you had any family members/close contacts diagnosed with the COVID 19 since your procedure?  no   If yes to any of these questions please route to Joylene John, RN and Alphonsa Gin, Therapist, sports.

## 2019-01-24 ENCOUNTER — Other Ambulatory Visit: Payer: Self-pay | Admitting: Gastroenterology

## 2019-01-24 MED ORDER — PANTOPRAZOLE SODIUM 40 MG PO TBEC: 40.0000 mg | DELAYED_RELEASE_TABLET | Freq: Every day | ORAL | 2 refills | Status: DC

## 2019-01-25 ENCOUNTER — Other Ambulatory Visit: Payer: Self-pay | Admitting: Internal Medicine

## 2019-01-25 MED ORDER — PROPRANOLOL HCL ER 160 MG PO CP24: ORAL_CAPSULE | ORAL | 3 refills | Status: DC

## 2019-04-24 ENCOUNTER — Other Ambulatory Visit: Payer: Self-pay

## 2019-04-24 MED ORDER — PANTOPRAZOLE SODIUM 40 MG PO TBEC: 40.0000 mg | DELAYED_RELEASE_TABLET | Freq: Every day | ORAL | 10 refills | Status: DC

## 2019-06-07 ENCOUNTER — Other Ambulatory Visit: Payer: Self-pay

## 2019-06-07 MED ORDER — COLESTIPOL HCL 1 G PO TABS: 1.0000 g | ORAL_TABLET | Freq: Every day | ORAL | 6 refills | Status: DC

## 2019-06-15 ENCOUNTER — Telehealth: Payer: Self-pay | Admitting: *Deleted

## 2019-06-15 MED ORDER — NORGESTIM-ETH ESTRAD TRIPHASIC 0.18/0.215/0.25 MG-35 MCG PO TABS: 1.0000 | ORAL_TABLET | Freq: Every day | ORAL | 0 refills | Status: DC

## 2019-06-15 NOTE — Telephone Encounter (Signed)
Patient called requesting refill on Tri-Privifem birth control, patient has appointment with new practice, however needs 1 pack sent to pharmacy. Rx sent.

## 2019-09-07 ENCOUNTER — Other Ambulatory Visit: Payer: Self-pay | Admitting: Obstetrics and Gynecology

## 2019-10-25 ENCOUNTER — Other Ambulatory Visit: Payer: Self-pay | Admitting: Obstetrics and Gynecology

## 2019-10-25 DIAGNOSIS — R928 Other abnormal and inconclusive findings on diagnostic imaging of breast: Secondary | ICD-10-CM

## 2019-11-03 ENCOUNTER — Ambulatory Visit
Admission: RE | Admit: 2019-11-03 | Discharge: 2019-11-03 | Disposition: A | Discharge status: Home / Self Care | Payer: 59 | Source: Ambulatory Visit | Attending: Obstetrics and Gynecology | Admitting: Obstetrics and Gynecology

## 2019-11-03 ENCOUNTER — Other Ambulatory Visit: Payer: Self-pay | Admitting: Obstetrics and Gynecology

## 2019-11-03 ENCOUNTER — Other Ambulatory Visit: Payer: Self-pay

## 2019-11-03 DIAGNOSIS — R928 Other abnormal and inconclusive findings on diagnostic imaging of breast: Secondary | ICD-10-CM

## 2019-12-12 ENCOUNTER — Other Ambulatory Visit: Payer: Self-pay | Admitting: Internal Medicine

## 2019-12-12 MED ORDER — DEXAMETHASONE 4 MG PO TABS
ORAL_TABLET | ORAL | 0 refills | Status: DC
Start: 2019-12-12 — End: 2020-01-03

## 2020-01-03 ENCOUNTER — Other Ambulatory Visit: Payer: Self-pay

## 2020-01-03 ENCOUNTER — Ambulatory Visit: Payer: 59 | Admitting: Internal Medicine

## 2020-01-03 ENCOUNTER — Encounter: Payer: Self-pay | Admitting: Internal Medicine

## 2020-01-03 VITALS — BP 122/72 | HR 68 | Temp 97.5°F | Resp 16 | Ht 63.0 in | Wt 146.6 lb

## 2020-01-03 DIAGNOSIS — Z87891 Personal history of nicotine dependence: Secondary | ICD-10-CM | POA: Diagnosis not present

## 2020-01-03 DIAGNOSIS — R7309 Other abnormal glucose: Secondary | ICD-10-CM

## 2020-01-03 DIAGNOSIS — Z136 Encounter for screening for cardiovascular disorders: Secondary | ICD-10-CM | POA: Diagnosis not present

## 2020-01-03 DIAGNOSIS — E785 Hyperlipidemia, unspecified: Secondary | ICD-10-CM

## 2020-01-03 DIAGNOSIS — E559 Vitamin D deficiency, unspecified: Secondary | ICD-10-CM

## 2020-01-03 DIAGNOSIS — Z111 Encounter for screening for respiratory tuberculosis: Secondary | ICD-10-CM

## 2020-01-03 DIAGNOSIS — Z0000 Encounter for general adult medical examination without abnormal findings: Secondary | ICD-10-CM

## 2020-01-03 DIAGNOSIS — G2581 Restless legs syndrome: Secondary | ICD-10-CM

## 2020-01-03 DIAGNOSIS — Z23 Encounter for immunization: Secondary | ICD-10-CM

## 2020-01-03 DIAGNOSIS — Z0001 Encounter for general adult medical examination with abnormal findings: Secondary | ICD-10-CM

## 2020-01-03 DIAGNOSIS — R030 Elevated blood-pressure reading, without diagnosis of hypertension: Secondary | ICD-10-CM

## 2020-01-03 DIAGNOSIS — R5383 Other fatigue: Secondary | ICD-10-CM

## 2020-01-03 DIAGNOSIS — Z1212 Encounter for screening for malignant neoplasm of rectum: Secondary | ICD-10-CM

## 2020-01-03 DIAGNOSIS — Z8249 Family history of ischemic heart disease and other diseases of the circulatory system: Secondary | ICD-10-CM | POA: Diagnosis not present

## 2020-01-03 DIAGNOSIS — G43109 Migraine with aura, not intractable, without status migrainosus: Secondary | ICD-10-CM

## 2020-01-03 DIAGNOSIS — Z79899 Other long term (current) drug therapy: Secondary | ICD-10-CM

## 2020-01-03 DIAGNOSIS — Z1211 Encounter for screening for malignant neoplasm of colon: Secondary | ICD-10-CM

## 2020-01-03 MED ORDER — ROPINIROLE HCL 1 MG PO TABS: ORAL_TABLET | ORAL | 0 refills | Status: DC

## 2020-01-03 NOTE — Patient Instructions (Signed)

## 2020-01-03 NOTE — Progress Notes (Signed)
Annual Screening/Preventative Visit & Comprehensive Evaluation &  Examination      This very nice 49 y.o.  MWF presents for a Screening /Preventative Visit & comprehensive evaluation and management of multiple medical co-morbidities.  Patient has been followed for HTN, HLD, Prediabetes  and Vitamin D Deficiency. Patient has GERD controlled on her meds. She also has hx/o Migraine controlled on propranolol prophylaxis.      Patient relates Covid Infection with mild respiratory sx's and loss of taste/smell in Sept and did have a (+) Covid test at [facility].        Patient is followed expectantly for labile HTN. Patient's BP has been controlled at home and patient denies any cardiac symptoms as chest pain, palpitations, shortness of breath, dizziness or ankle swelling. Today's BP is at goal  -  122/72.       Patient's hyperlipidemia is controlled with diet and Colestid (which she takes for IBS-D).  Patient denies myalgias or other medication SE's. Last lipids were at goal:  Lab Results  Component Value Date   CHOL 185 12/01/2018   HDL 72 12/01/2018   LDLCALC 92 12/01/2018   TRIG 118 12/01/2018   CHOLHDL 2.6 12/01/2018       Patient has hx/o prediabetes (A1c 5.8% /2012 & 5.7% /2017) and patient denies reactive hypoglycemic symptoms, visual blurring, diabetic polys or paresthesias. Last A1c was Normal & at goal:  Lab Results  Component Value Date   HGBA1C 5.5 12/01/2018       Finally, patient has history of Vitamin D Deficiency ("40" on Tx /2009) and last Vitamin D was at goal:  Lab Results  Component Value Date   VD25OH 94 12/01/2018    Current Outpatient Medications on File Prior to Visit  Medication Sig  . aspirin EC 81 MG tablet Take 81 mg by mouth daily.  . Cholecalciferol (VITAMIN D PO) Take 6,000 Units by mouth.    . colestipol (COLESTID) 1 g tablet Take 1 tablet (1 g total) by mouth daily.  . Loratadine (CLARITIN PO) Take 1 tablet by mouth daily as needed.   . LO LOESTRIN  FE 1 MG-10 mcg / 10 mcg  Take 1 tablet by mouth daily.  . pantoprazole 40 MG tablet Take 1 tablet (40 mg total) by mouth daily.  . propranolol ER  160 MG SR capsule Take 1 tablet Daily to Prevent Migraine    Allergies  Allergen Reactions  . Imitrex [Sumatriptan]     Throat swelling  . Viberzi [Eluxadoline]     Throat swelling   . Celexa [Citalopram]     Decreased libido  . Codeine     N/V  . Topamax [Topiramate]     Neurological changes  . Zoloft [Sertraline Hcl]     N/V   Past Medical History:  Diagnosis Date  . Allergy   . Anemia   . Anxiety   . Depression   . GERD (gastroesophageal reflux disease)   . IBS (irritable bowel syndrome)   . Migraines   . Restless leg syndrome    Health Maintenance  Topic Date Due  . COVID-19 Vaccine (1) Never done  . PAP SMEAR-Modifier  07/13/2020  . MAMMOGRAM  09/22/2020  . COLONOSCOPY  01/05/2026  . TETANUS/TDAP  07/21/2026  . INFLUENZA VACCINE  Completed  . Hepatitis C Screening  Completed  . HIV Screening  Completed   Immunization History  Administered Date(s) Administered  . Influenza Inj Mdck Quad With Preservative 01/03/2020  . Influenza Split  01/14/2011, 02/16/2013, 01/10/2015  . Influenza-Unspecified 02/11/2014  . PPD Test 08/10/2013, 08/14/2014, 08/15/2015, 10/05/2016, 11/02/2017, 12/01/2018, 01/03/2020  . Pneumococcal-Unspecified 07/29/2010  . Td 03/31/2003  . Tdap 08/14/2014, 07/20/2016    Last Colon - 01/06/2019 - Dr Ardis Hughs- Recc 7 year f/u due Oct 2027  Last MGM - 11/03/2019 - 6 mo f/u 05/08/2020 Left   Past Surgical History:  Procedure Laterality Date  . CHOLECYSTECTOMY    . TERMINATION OF PREGNANC     18 WEEKS--DOWNS SYNDROME   Family History  Problem Relation Age of Onset  . Diabetes Mother   . Hypertension Mother   . Heart attack Mother   . Hypertension Father   . CVA Maternal Grandfather   . Colon cancer Neg Hx   . Esophageal cancer Neg Hx   . Stomach cancer Neg Hx   . Rectal cancer Neg Hx     Social History   Tobacco Use  . Smoking status: Former Smoker    Quit date: 03/30/1996    Years since quitting: 23.7  . Smokeless tobacco: Never Used  Vaping Use  . Vaping Use: Never used  Substance Use Topics  . Alcohol use: Yes    Alcohol/week: 0.0 standard drinks    Comment: OCC  . Drug use: No    ROS Constitutional: Denies fever, chills, weight loss/gain, headaches, insomnia,  night sweats, and change in appetite. Does c/o fatigue. Eyes: Denies redness, blurred vision, diplopia, discharge, itchy, watery eyes.  ENT: Denies discharge, congestion, post nasal drip, epistaxis, sore throat, earache, hearing loss, dental pain, Tinnitus, Vertigo, Sinus pain, snoring.  Cardio: Denies chest pain, palpitations, irregular heartbeat, syncope, dyspnea, diaphoresis, orthopnea, PND, claudication, edema Respiratory: denies cough, dyspnea, DOE, pleurisy, hoarseness, laryngitis, wheezing.  Gastrointestinal: Denies dysphagia, heartburn, reflux, water brash, pain, cramps, nausea, vomiting, bloating, diarrhea, constipation, hematemesis, melena, hematochezia, jaundice, hemorrhoids Genitourinary: Denies dysuria, frequency, urgency, nocturia, hesitancy, discharge, hematuria, flank pain Breast: Breast lumps, nipple discharge, bleeding.  Musculoskeletal: Denies arthralgia, myalgia, stiffness, Jt. Swelling, pain, limp, and strain/sprain. Denies falls. Skin: Denies puritis, rash, hives, warts, acne, eczema, changing in skin lesion Neuro: No weakness, tremor, incoordination, spasms, paresthesia, pain Psychiatric: Denies confusion, memory loss, sensory loss. Denies Depression. Endocrine: Denies change in weight, skin, hair change, nocturia, and paresthesia, diabetic polys, visual blurring, hyper / hypo glycemic episodes.  Heme/Lymph: No excessive bleeding, bruising, enlarged lymph nodes.  Physical Exam  BP 122/72   Pulse 68   Temp (!) 97.5 F (36.4 C)   Resp 16   Ht 5\' 3"  (1.6 m)   Wt 146 lb 9.6 oz  (66.5 kg)   SpO2 97%   BMI 25.97 kg/m   General Appearance: Well nourished, well groomed and in no apparent distress.  Eyes: PERRLA, EOMs, conjunctiva no swelling or erythema, normal fundi and vessels. Sinuses: No frontal/maxillary tenderness ENT/Mouth: EACs patent / TMs  nl. Nares clear without erythema, swelling, mucoid exudates. Oral hygiene is good. No erythema, swelling, or exudate. Tongue normal, non-obstructing. Tonsils not swollen or erythematous. Hearing normal.  Neck: Supple, thyroid not palpable. No bruits, nodes or JVD. Respiratory: Respiratory effort normal.  BS equal and clear bilateral without rales, rhonci, wheezing or stridor. Cardio: Heart sounds are normal with regular rate and rhythm and no murmurs, rubs or gallops. Peripheral pulses are normal and equal bilaterally without edema. No aortic or femoral bruits. Chest: symmetric with normal excursions and percussion. Breasts:  Def to GYN Abdomen: Flat, soft with bowel sounds active. Nontender, no guarding, rebound, hernias, masses, or organomegaly.  Lymphatics: Non tender without lymphadenopathy.  Musculoskeletal: Full ROM all peripheral extremities, joint stability, 5/5 strength, and normal gait. Skin: Warm and dry without rashes, lesions, cyanosis, clubbing or  ecchymosis.  Neuro: Cranial nerves intact, reflexes equal bilaterally. Normal muscle tone, no cerebellar symptoms. Sensation intact.  Pysch: Alert and oriented X 3, normal affect, Insight and Judgment appropriate.   Assessment and Plan  1. Annual Preventative Screening Examination   2. Elevated BP without diagnosis of hypertension  - EKG 12-Lead - [place], RETROPERITNL ABD,  LTD - Urinalysis, Routine w reflex microscopic - Microalbumin / creatinine urine ratio - CBC with Differential/Platelet - COMPLETE METABOLIC PANEL WITH GFR - Magnesium - TSH  3. Elevated lipids  - EKG 12-Lead - [place], RETROPERITNL ABD,  LTD - Lipid panel - TSH  4. Abnormal  glucose  - EKG 12-Lead - [place], RETROPERITNL ABD,  LTD - Hemoglobin A1c - Insulin, random  5. Migraine with aura   6. Vitamin D deficiency  - VITAMIN D 25 Hydroxy   7. Screening examination for pulmonary tuberculosis  - TB Skin Test  8. Screening for colorectal cancer  - POC Hemoccult Bld/Stl   9. Screening for ischemic heart disease  - EKG 12-Lead  10. FHx: heart disease  - EKG 12-Lead - [place], RETROPERITNL ABD,  LTD  11. Former smoker  - EKG 12-Lead - [place], RETROPERITNL ABD,  LTD  12. Screening for AAA (aortic abdominal aneurysm)  - [place], RETROPERITNL ABD,  LTD  13. Fatigue, unspecified type  - Iron,Total/Total Iron Binding Cap - CBC with Differential/Platelet  14. Medication management  - Urinalysis, Routine w reflex microscopic - Microalbumin / creatinine urine ratio - CBC with Differential/Platelet - COMPLETE METABOLIC PANEL WITH GFR - Magnesium - Lipid panel - TSH - Hemoglobin A1c - Insulin, random - VITAMIN D 25 Hydroxy   15. Need for immunization against influenza  - FLU VACCINE MDCK QUAD W/Preservative        Patient relates classic sx's of RLS precluding sleep and desires to try meds . Discussed Requip.       Patient was counseled in prudent diet to achieve/maintain BMI less than 25 for weight control, BP monitoring, regular exercise and medications. Discussed med's effects and SE's. Screening labs and tests as requested with regular follow-up as recommended. Over 40 minutes of exam, counseling, chart review and high complex critical decision making was performed.   Kirtland Bouchard, MD

## 2020-01-05 LAB — CBC WITH DIFFERENTIAL/PLATELET
Absolute Monocytes: 512 cells/uL (ref 200–950)
Basophils Absolute: 22 cells/uL (ref 0–200)
Basophils Relative: 0.4 %
Eosinophils Absolute: 132 cells/uL (ref 15–500)
Eosinophils Relative: 2.4 %
HCT: 38.3 % (ref 35.0–45.0)
Hemoglobin: 13 g/dL (ref 11.7–15.5)
Lymphs Abs: 1458 cells/uL (ref 850–3900)
MCH: 31.6 pg (ref 27.0–33.0)
MCHC: 33.9 g/dL (ref 32.0–36.0)
MCV: 93.2 fL (ref 80.0–100.0)
MPV: 10.9 fL (ref 7.5–12.5)
Monocytes Relative: 9.3 %
Neutro Abs: 3377 cells/uL (ref 1500–7800)
Neutrophils Relative %: 61.4 %
Platelets: 256 10*3/uL (ref 140–400)
RBC: 4.11 10*6/uL (ref 3.80–5.10)
RDW: 11.7 % (ref 11.0–15.0)
Total Lymphocyte: 26.5 %
WBC: 5.5 10*3/uL (ref 3.8–10.8)

## 2020-01-05 LAB — COMPLETE METABOLIC PANEL WITH GFR
AG Ratio: 1.6 (calc) (ref 1.0–2.5)
ALT: 18 U/L (ref 6–29)
AST: 16 U/L (ref 10–35)
Albumin: 4.1 g/dL (ref 3.6–5.1)
Alkaline phosphatase (APISO): 72 U/L (ref 37–153)
BUN: 12 mg/dL (ref 7–25)
CO2: 26 mmol/L (ref 20–32)
Calcium: 9.4 mg/dL (ref 8.6–10.4)
Chloride: 103 mmol/L (ref 98–110)
Creat: 0.81 mg/dL (ref 0.50–1.05)
GFR, Est African American: 97 mL/min/{1.73_m2} (ref >=60)
GFR, Est Non African American: 84 mL/min/{1.73_m2} (ref >=60)
Globulin: 2.6 g/dL (calc) (ref 1.9–3.7)
Glucose, Bld: 81 mg/dL (ref 65–99)
Potassium: 4 mmol/L (ref 3.5–5.3)
Sodium: 137 mmol/L (ref 135–146)
Total Bilirubin: 1.4 mg/dL — ABNORMAL HIGH (ref 0.2–1.2)
Total Protein: 6.7 g/dL (ref 6.1–8.1)

## 2020-01-05 LAB — URINALYSIS, ROUTINE W REFLEX MICROSCOPIC
Bilirubin Urine: NEGATIVE
Glucose, UA: NEGATIVE
Hgb urine dipstick: NEGATIVE
Ketones, ur: NEGATIVE
Leukocytes,Ua: NEGATIVE
Nitrite: NEGATIVE
Protein, ur: NEGATIVE
Specific Gravity, Urine: 1.008 (ref 1.001–1.03)
pH: 5.5 (ref 5.0–8.0)

## 2020-01-05 LAB — LIPID PANEL
Cholesterol: 197 mg/dL (ref <200)
HDL: 54 mg/dL (ref >=50)
LDL Cholesterol (Calc): 118 mg/dL (calc) — ABNORMAL HIGH
Non-HDL Cholesterol (Calc): 143 mg/dL (calc) — ABNORMAL HIGH (ref <130)
Total CHOL/HDL Ratio: 3.6 (calc) (ref <5.0)
Triglycerides: 139 mg/dL (ref <150)

## 2020-01-05 LAB — MICROALBUMIN / CREATININE URINE RATIO
Creatinine, Urine: 47 mg/dL (ref 20–275)
Microalb Creat Ratio: 4 mcg/mg creat (ref <30)
Microalb, Ur: 0.2 mg/dL

## 2020-01-05 LAB — IRON, TOTAL/TOTAL IRON BINDING CAP
%SAT: 43 % (calc) (ref 16–45)
Iron: 157 ug/dL (ref 45–160)
TIBC: 362 mcg/dL (calc) (ref 250–450)

## 2020-01-05 LAB — VITAMIN D 25 HYDROXY (VIT D DEFICIENCY, FRACTURES): Vit D, 25-Hydroxy: 111 ng/mL — ABNORMAL HIGH (ref 30–100)

## 2020-01-05 LAB — MAGNESIUM: Magnesium: 1.9 mg/dL (ref 1.5–2.5)

## 2020-01-05 LAB — HEMOGLOBIN A1C
Hgb A1c MFr Bld: 5.8 % of total Hgb — ABNORMAL HIGH (ref <5.7)
Mean Plasma Glucose: 120 (calc)
eAG (mmol/L): 6.6 (calc)

## 2020-01-05 LAB — TSH: TSH: 2.52 mIU/L

## 2020-01-05 LAB — INSULIN, RANDOM: Insulin: 2.5 u[IU]/mL

## 2020-01-06 NOTE — Progress Notes (Signed)
========================================================== -   Test results slightly outside the reference range are not unusual. If there is anything important, I will review this with you,  otherwise it is considered normal test values.  If you have further questions,  please do not hesitate to contact me at the office or via My Chart.  ==========================================================  -  Iron levels - Normal & OK  ==========================================================  -  Total Chol = elevated  (Ideal or Goal is less than 180)   - and Bad / Dangerous LDL Chol = 118 - also very elevated  (Ideal or Goal is less than 70  !  )  - Cholesterol is too high   - Recommend a stricter  low cholesterol diet   - Cholesterol only comes from animal sources  - ie. meat, dairy, egg yolks  - Eat all the vegetables you want.  - Avoid meat, especially red meat - Beef AND Pork .  - Avoid cheese & dairy - milk & ice cream.     - Cheese is the most concentrated form of trans-fats which  is the worst thing to clog up our arteries.   - Veggie cheese is OK which can be found in the fresh  produce section at [employer] or [employer] or [employer] ==========================================================  -  A1c = 5.8% -   - Blood sugar and A1c are slightly elevated in the borderline and  early or pre-diabetes range which has the same   300% increased risk for heart attack, stroke, cancer and   alzheimer- type vascular dementia as full blown diabetes.   But the good news is that diet, exercise with  weight loss can cure the early diabetes at this point. ==========================================================  -  Vitamin D - 111 is slightly  elevated   - Vitamin D goal is between 70-100.   - Please DECREASE your Vitamin D 6,000 down to 4,000 units /day  - It is very important as a natural anti-inflammatory and helping the  immune system protect against viral  infections, like the Covid-19    helping hair, skin, and nails, as well as reducing stroke and  heart attack risk.   - It helps your bones and helps with mood.  - It also decreases numerous cancer risks so please  take it as directed.   - Low Vit D is associated with a 200-300% higher risk for  CANCER   and 200-300% higher risk for HEART   ATTACK  &  STROKE.    - It is also associated with higher death rate at younger ages,   autoimmune diseases like Rheumatoid arthritis, Lupus,  Multiple Sclerosis.     - Also many other serious conditions, like depression, Alzheimer's  Dementia, infertility, muscle aches, fatigue, fibromyalgia   - just to name a few. ==========================================================  - All Else - CBC - Kidneys - Electrolytes - Liver - Magnesium & Thyroid    - all  Normal / OK ==========================================================   - Keep up the [place] Work ! ==========================================================

## 2020-01-27 ENCOUNTER — Other Ambulatory Visit: Payer: Self-pay | Admitting: Internal Medicine

## 2020-01-27 DIAGNOSIS — G2581 Restless legs syndrome: Secondary | ICD-10-CM

## 2020-02-15 ENCOUNTER — Other Ambulatory Visit: Payer: Self-pay | Admitting: Internal Medicine

## 2020-03-12 ENCOUNTER — Other Ambulatory Visit: Payer: Self-pay

## 2020-03-12 MED ORDER — COLESTIPOL HCL 1 G PO TABS: 1.0000 g | ORAL_TABLET | Freq: Every day | ORAL | 1 refills | Status: DC

## 2020-04-12 ENCOUNTER — Other Ambulatory Visit: Payer: Self-pay

## 2020-04-12 MED ORDER — COLESTIPOL HCL 1 G PO TABS: 1.0000 g | ORAL_TABLET | Freq: Every day | ORAL | 0 refills | Status: DC

## 2020-05-03 ENCOUNTER — Other Ambulatory Visit: Payer: Self-pay | Admitting: Gastroenterology

## 2020-05-08 ENCOUNTER — Ambulatory Visit
Admission: RE | Admit: 2020-05-08 | Discharge: 2020-05-08 | Disposition: A | Discharge status: Home / Self Care | Payer: 59 | Source: Ambulatory Visit | Attending: Obstetrics and Gynecology | Admitting: Obstetrics and Gynecology

## 2020-05-08 ENCOUNTER — Other Ambulatory Visit: Payer: Self-pay

## 2020-05-08 DIAGNOSIS — R928 Other abnormal and inconclusive findings on diagnostic imaging of breast: Secondary | ICD-10-CM

## 2020-05-19 ENCOUNTER — Other Ambulatory Visit: Payer: Self-pay | Admitting: Gastroenterology

## 2020-05-20 ENCOUNTER — Other Ambulatory Visit: Payer: Self-pay | Admitting: Internal Medicine

## 2020-05-20 ENCOUNTER — Other Ambulatory Visit: Payer: Self-pay | Admitting: Gastroenterology

## 2020-06-13 ENCOUNTER — Other Ambulatory Visit: Payer: Self-pay | Admitting: Gastroenterology

## 2020-07-12 ENCOUNTER — Other Ambulatory Visit: Payer: Self-pay | Admitting: Gastroenterology

## 2020-07-29 ENCOUNTER — Other Ambulatory Visit: Payer: Self-pay | Admitting: Gastroenterology

## 2020-08-13 ENCOUNTER — Other Ambulatory Visit: Payer: Self-pay | Admitting: Gastroenterology

## 2020-08-17 ENCOUNTER — Other Ambulatory Visit: Payer: Self-pay | Admitting: Internal Medicine

## 2020-09-04 ENCOUNTER — Other Ambulatory Visit: Payer: Self-pay | Admitting: Gastroenterology

## 2020-09-14 ENCOUNTER — Other Ambulatory Visit: Payer: Self-pay | Admitting: Gastroenterology

## 2020-10-01 ENCOUNTER — Other Ambulatory Visit: Payer: Self-pay | Admitting: Gastroenterology

## 2020-10-04 ENCOUNTER — Encounter: Payer: Self-pay | Admitting: Gastroenterology

## 2020-10-04 ENCOUNTER — Ambulatory Visit: Payer: 59 | Admitting: Gastroenterology

## 2020-10-04 VITALS — BP 100/58 | HR 75 | Ht 62.0 in | Wt 136.0 lb

## 2020-10-04 DIAGNOSIS — K219 Gastro-esophageal reflux disease without esophagitis: Secondary | ICD-10-CM

## 2020-10-04 DIAGNOSIS — R195 Other fecal abnormalities: Secondary | ICD-10-CM

## 2020-10-04 MED ORDER — PANTOPRAZOLE SODIUM 40 MG PO TBEC: DELAYED_RELEASE_TABLET | ORAL | 11 refills | Status: DC

## 2020-10-04 MED ORDER — COLESTIPOL HCL 1 G PO TABS: 1.0000 g | ORAL_TABLET | Freq: Every day | ORAL | 11 refills | Status: DC

## 2020-10-04 NOTE — Progress Notes (Signed)
Review of pertinent gastrointestinal problems: 1. GERD in September 2007. She had EGD on 12/17/2005 which was a normal examination. She was felt to have nonerosive GERD. 2. Chronic loose stools, likely bile acid related; started after GB resection (1998).  Cholestyramine started 2016 has significantly helped. 3.  Erosion versus shallow ulcer distal end of the IC valve noted by colonoscopy August 2020.  Biopsies taken.  Pathology was quite unusual and read as "sessile serrated polyp" I discussed this with pathology personally.  The site of the erosion was reevaluated by colonoscopy October 2020 clearly healed.  Mucosa was still slightly edematous and biopsies were taken.  2 subcentimeter polyps were also found and removed.  Biopsies showed no sign of cancer.  The polyps were typical adenomas.  Recall colonoscopy 7 years.   HPI: This is a very pleasant 50 year old woman whom I last saw the time of a colonoscopy 2020.  See those results summarized above  She is here today for refills on her Colestid and her proton pump inhibitor.  She says the Colestid is a miracle drug.  Significantly helps at every other day dosing for her choleretic diarrhea.  She loves this medicine.  It is changed her life.  She also needs refills on her proton pump inhibitor.  She is on pantoprazole 40 mg.  She takes 1 pill every other day and on that regimen she has no GERD symptoms and no dysphagia.  She has not tried cutting back to as needed dosing.  She has intentionally lost 20 pounds on intermittent fasting over the past several months.  No overt GI bleeding.  ROS: complete GI ROS as described in HPI, all other review negative.  Constitutional:  No unintentional weight loss   Past Medical History:  Diagnosis Date   Allergy    Anemia    Anxiety    Depression    GERD (gastroesophageal reflux disease)    IBS (irritable bowel syndrome)    Migraines    Restless leg syndrome     Past Surgical History:   Procedure Laterality Date   CHOLECYSTECTOMY     TERMINATION OF PREGNANC     18 WEEKS--DOWNS SYNDROME    Current Outpatient Medications  Medication Sig Dispense Refill   aspirin EC 81 MG tablet Take 81 mg by mouth daily.     Cholecalciferol (VITAMIN D PO) Take 6,000 Units by mouth.       colestipol (COLESTID) 1 g tablet TAKE 1 TABLET BY MOUTH EVERY DAY 15 tablet 0   Loratadine (CLARITIN PO) Take 1 tablet by mouth daily as needed.      Norethindrone-Ethinyl Estradiol-Fe Biphas (LO LOESTRIN FE) 1 MG-10 MCG / 10 MCG tablet Take 1 tablet by mouth daily.     pantoprazole (PROTONIX) 40 MG tablet TAKE 1 TABLET BY MOUTH DAILY. *SCHEDULE FOLLOW UP FOR FURTHER REFILLS* 15 tablet 0   propranolol ER (INDERAL LA) 160 MG SR capsule TAKE 1 CAPSULE DAILY TO PREVENT MIGRAINES 90 capsule 3   No current facility-administered medications for this visit.    Allergies as of 10/04/2020 - Review Complete 10/04/2020  Allergen Reaction Noted   Imitrex [sumatriptan]  02/16/2013   Viberzi [eluxadoline]  08/06/2015   Celexa [citalopram]  02/15/2013   Codeine  02/15/2013   Topamax [topiramate]  03/01/2018   Zoloft [sertraline hcl]  02/15/2013    Family History  Problem Relation Age of Onset   Diabetes Mother    Hypertension Mother    Heart attack Mother  Hypertension Father    CVA Maternal Grandfather    Colon cancer Neg Hx    Esophageal cancer Neg Hx    Stomach cancer Neg Hx    Rectal cancer Neg Hx     Social History   Socioeconomic History   Marital status: Married    Spouse name: Not on file   Number of children: 1   Years of education: Not on file   Highest education level: Not on file  Occupational History   Occupation: Chief Financial Officer  Tobacco Use   Smoking status: Former    Pack years: 0.00    Types: Cigarettes    Quit date: 03/30/1996    Years since quitting: 24.5   Smokeless tobacco: Never  Vaping Use   Vaping Use: Never used  Substance and Sexual Activity   Alcohol use:  Yes    Alcohol/week: 0.0 standard drinks    Comment: OCC   Drug use: No   Sexual activity: Yes    Birth control/protection: Pill    Comment: 1st intercourse 50 yo-Fewer than 5 partners  Other Topics Concern   Not on file  Social History Narrative   Not on file   Social Determinants of Health   Financial Resource Strain: Not on file  Food Insecurity: Not on file  Transportation Needs: Not on file  Physical Activity: Not on file  Stress: Not on file  Social Connections: Not on file  Intimate Partner Violence: Not on file     Physical Exam: BP (!) 100/58   Pulse 75   Ht 5\' 2"  (1.575 m)   Wt 136 lb (61.7 kg)   SpO2 99%   BMI 24.87 kg/m  Constitutional: generally well-appearing Psychiatric: alert and oriented x3 Abdomen: soft, nontender, nondistended, no obvious ascites, no peritoneal signs, normal bowel sounds No peripheral edema noted in lower extremities  Assessment and plan: 50 y.o. female with chronic GERD, choleretic diarrhea  I am happy to call in refills for her for her Colestid and her proton pump inhibitor.  She knows that if she still needs these prescriptions medicines in 2 years and I would like to see her in the office per policy.    She will try to cut back to as needed dosing of her proton pump inhibitor.  No need for endoscopic procedures as no alarm symptoms.  Please see the "Patient Instructions" section for addition details about the plan.  Owens Loffler, MD [facility] 10/04/2020, 11:05 AM   Total time on date of encounter was 20 minutes (this included time spent preparing to see the patient reviewing records; obtaining and/or reviewing separately obtained history; performing a medically appropriate exam and/or evaluation; counseling and educating the patient and family if present; ordering medications, tests or procedures if applicable; and documenting clinical information in the health record).

## 2020-10-04 NOTE — Patient Instructions (Addendum)
If you are age 52 or younger, your body mass index should be between 19-25. Your Body mass index is 24.87 kg/m. If this is out of the aformentioned range listed, please consider follow up with your Primary Care Provider.  __________________________________________________________  The [facility] providers would like to encourage you to use [facility] to communicate with providers for non-urgent requests or questions.  Due to long hold times on the telephone, sending your provider a message by [facility] [place] may be a faster and more efficient way to get a response.  Please allow 48 business hours for a response.  Please remember that this is for non-urgent requests.  __________________________________________________________ We have sent the following medications to your pharmacy for you to pick up at your convenience:  Pantoprazole 40mg  one pill once daily (try to cut back to an as needed dosing if you can) Colestid 1g  __________________________________________________________  If you still need these prescription medicines in 2 years and we would like to see you in the office for follow up per policy.    Thank you for entrusting me with your care and choosing [facility].  Dr Ardis Hughs

## 2021-01-08 ENCOUNTER — Ambulatory Visit (INDEPENDENT_AMBULATORY_CARE_PROVIDER_SITE_OTHER): Payer: 59 | Admitting: Internal Medicine

## 2021-01-08 ENCOUNTER — Other Ambulatory Visit: Payer: Self-pay

## 2021-01-08 ENCOUNTER — Encounter: Payer: Self-pay | Admitting: Internal Medicine

## 2021-01-08 VITALS — BP 118/70 | HR 64 | Temp 97.1°F | Resp 16 | Ht 62.0 in | Wt 137.4 lb

## 2021-01-08 DIAGNOSIS — R030 Elevated blood-pressure reading, without diagnosis of hypertension: Secondary | ICD-10-CM

## 2021-01-08 DIAGNOSIS — Z87891 Personal history of nicotine dependence: Secondary | ICD-10-CM

## 2021-01-08 DIAGNOSIS — E559 Vitamin D deficiency, unspecified: Secondary | ICD-10-CM

## 2021-01-08 DIAGNOSIS — G43109 Migraine with aura, not intractable, without status migrainosus: Secondary | ICD-10-CM

## 2021-01-08 DIAGNOSIS — Z111 Encounter for screening for respiratory tuberculosis: Secondary | ICD-10-CM

## 2021-01-08 DIAGNOSIS — R7309 Other abnormal glucose: Secondary | ICD-10-CM

## 2021-01-08 DIAGNOSIS — Z0001 Encounter for general adult medical examination with abnormal findings: Secondary | ICD-10-CM

## 2021-01-08 DIAGNOSIS — Z23 Encounter for immunization: Secondary | ICD-10-CM

## 2021-01-08 DIAGNOSIS — Z0000 Encounter for general adult medical examination without abnormal findings: Secondary | ICD-10-CM | POA: Diagnosis not present

## 2021-01-08 DIAGNOSIS — Z136 Encounter for screening for cardiovascular disorders: Secondary | ICD-10-CM

## 2021-01-08 DIAGNOSIS — Z1211 Encounter for screening for malignant neoplasm of colon: Secondary | ICD-10-CM

## 2021-01-08 DIAGNOSIS — E785 Hyperlipidemia, unspecified: Secondary | ICD-10-CM

## 2021-01-08 DIAGNOSIS — R5383 Other fatigue: Secondary | ICD-10-CM

## 2021-01-08 DIAGNOSIS — Z79899 Other long term (current) drug therapy: Secondary | ICD-10-CM

## 2021-01-08 DIAGNOSIS — Z8249 Family history of ischemic heart disease and other diseases of the circulatory system: Secondary | ICD-10-CM

## 2021-01-08 DIAGNOSIS — F5101 Primary insomnia: Secondary | ICD-10-CM

## 2021-01-08 MED ORDER — GABAPENTIN 100 MG PO CAPS
ORAL_CAPSULE | ORAL | 0 refills | Status: DC
Start: 2021-01-08 — End: 2021-02-15

## 2021-01-08 NOTE — Patient Instructions (Signed)
Due to recent changes in healthcare laws, you may see the results of your imaging and laboratory studies on MyChart before your provider has had a chance to review them.  We understand that in some cases there may be results that are confusing or concerning to you. Not all laboratory results come back in the same time frame and the provider may be waiting for multiple results in order to interpret others.  Please give [place] 48 hours in order for your provider to thoroughly review all the results before contacting the office for clarification of your results.   ++++++++++++++++++++++++++++++  Vit D  & Vit C 1,000 mg   are recommended to help protect  against the Covid-19 and other Corona viruses.    Also it's recommended  to take  Zinc 50 mg  to help  protect against the Covid-19   and best place to get  is also on [email or web address]  and don't pay more than 6-8 cents /pill !  ================================ Coronavirus (COVID-19) Are you at risk?  Are you at risk for the Coronavirus (COVID-19)?  To be considered HIGH RISK for Coronavirus (COVID-19), you have to meet the following criteria:  Traveled to [place], [place], [place], [place] or [place]; or in the [place] to [place], [place], [place]  or [place]; and have fever, cough, and shortness of breath within the last 2 weeks of travel OR Been in close contact with a person diagnosed with COVID-19 within the last 2 weeks and have  fever, cough,and shortness of breath  IF YOU DO NOT MEET THESE CRITERIA, YOU ARE CONSIDERED LOW RISK FOR COVID-19.  What to do if you are HIGH RISK for COVID-19?  If you are having a medical emergency, call 911. Seek medical care right away. Before you go to a doctor's office, urgent care or emergency department,  call ahead and tell them about your recent travel, contact with someone diagnosed with COVID-19   and your symptoms.  You should receive instructions from your physician's office regarding  next steps of care.  When you arrive at healthcare provider, tell the healthcare staff immediately you have returned from  visiting [place], [place], [place], [place] or [place]; or traveled in the [place] to [place], [place],  [place] or [place] in the last two weeks or you have been in close contact with a person diagnosed with  COVID-19 in the last 2 weeks.   Tell the health care staff about your symptoms: fever, cough and shortness of breath. After you have been seen by a medical provider, you will be either: Tested for (COVID-19) and discharged home on quarantine except to seek medical care if  symptoms worsen, and asked to  Stay home and avoid contact with others until you get your results (4-5 days)  Avoid travel on public transportation if possible (such as bus, train, or airplane) or Sent to the Emergency Department by EMS for evaluation, COVID-19 testing  and  possible admission depending on your condition and test results.  What to do if you are LOW RISK for COVID-19?  Reduce your risk of any infection by using the same precautions used for avoiding the common cold or flu:  Wash your hands often with soap and warm water for at least 20 seconds.  If soap and water are not readily available,  use an alcohol-based hand sanitizer with at least 60% alcohol.  If coughing or sneezing, cover your mouth and nose by coughing  or sneezing into the elbow areas of your shirt or coat,  into a tissue or into your sleeve (not your hands). Avoid shaking hands with others and consider head nods or verbal greetings only. Avoid touching your eyes, nose, or mouth with unwashed hands.  Avoid close contact with people who are sick. Avoid places or events with large numbers of people in one location, like concerts or sporting events. Carefully consider travel plans you have or are making. If you are planning any travel outside or inside the [place], visit the CDC's Travelers' Health webpage for  the latest health notices. If you have some symptoms but not all symptoms, continue to monitor at home and seek medical attention  if your symptoms worsen. If you are having a medical emergency, call 911. >>>>>>>>>>>>>>>>>>>>>>> Preventive Care for Adults  A healthy lifestyle and preventive care can promote health and wellness. Preventive health guidelines for women include the following key practices. A routine yearly physical is a good way to check with your health care provider about your health and preventive screening. It is a chance to share any concerns and updates on your health and to receive a thorough exam. Visit your dentist for a routine exam and preventive care every 6 months. Brush your teeth twice a day and floss once a day. Good oral hygiene prevents tooth decay and gum disease. The frequency of eye exams is based on your age, health, family medical history, use of contact lenses, and other factors. Follow your health care provider's recommendations for frequency of eye exams. Eat a healthy diet. Foods like vegetables, fruits, whole grains, low-fat dairy products, and lean protein foods contain the nutrients you need without too many calories. Decrease your intake of foods high in solid fats, added sugars, and salt. Eat the right amount of calories for you. Get information about a proper diet from your health care provider, if necessary. Regular physical exercise is one of the most important things you can do for your health. Most adults should get at least 150 minutes of moderate-intensity exercise (any activity that increases your heart rate and causes you to sweat) each week. In addition, most adults need muscle-strengthening exercises on 2 or more days a week. Maintain a healthy weight. The body mass index (BMI) is a screening tool to identify possible weight problems. It provides an estimate of body fat based on height and weight. Your health care provider can find your BMI and can  help you achieve or maintain a healthy weight. For adults 20 years and older: A BMI below 18.5 is considered underweight. A BMI of 18.5 to 24.9 is normal. A BMI of 25 to 29.9 is considered overweight. A BMI of 30 and above is considered obese. Maintain normal blood lipids and cholesterol levels by exercising and minimizing your intake of saturated fat. Eat a balanced diet with plenty of fruit and vegetables. Blood tests for lipids and cholesterol should begin at age 43 and be repeated every 5 years. If your lipid or cholesterol levels are high, you are over 50, or you are at high risk for heart disease, you may need your cholesterol levels checked more frequently. Ongoing high lipid and cholesterol levels should be treated with medicines if diet and exercise are not working. If you smoke, find out from your health care provider how to quit. If you do not use tobacco, do not start. Lung cancer screening is recommended for adults aged 11-80 years who are at high risk for developing  lung cancer because of a history of smoking. A yearly low-dose CT scan of the lungs is recommended for people who have at least a 30-pack-year history of smoking and are a current smoker or have quit within the past 15 years. A pack year of smoking is smoking an average of 1 pack of cigarettes a day for 1 year (for example: 1 pack a day for 30 years or 2 packs a day for 15 years). Yearly screening should continue until the smoker has stopped smoking for at least 15 years. Yearly screening should be stopped for people who develop a health problem that would prevent them from having lung cancer treatment. High blood pressure causes heart disease and increases the risk of stroke. Your blood pressure should be checked at least every 1 to 2 years. Ongoing high blood pressure should be treated with medicines if weight loss and exercise do not work. If you are 32-3 years old, ask your health care provider if you should take aspirin to  prevent strokes. Diabetes screening involves taking a blood sample to check your fasting blood sugar level. This should be done once every 3 years, after age 32, if you are within normal weight and without risk factors for diabetes. Testing should be considered at a younger age or be carried out more frequently if you are overweight and have at least 1 risk factor for diabetes. Breast cancer screening is essential preventive care for women. You should practice "breast self-awareness." This means understanding the normal appearance and feel of your breasts and may include breast self-examination. Any changes detected, no matter how small, should be reported to a health care provider. Women in their 45s and 30s should have a clinical breast exam (CBE) by a health care provider as part of a regular health exam every 1 to 3 years. After age 83, women should have a CBE every year. Starting at age 92, women should consider having a mammogram (breast X-ray test) every year. Women who have a family history of breast cancer should talk to their health care provider about genetic screening. Women at a high risk of breast cancer should talk to their health care providers about having an MRI and a mammogram every year. Breast cancer gene (BRCA)-related cancer risk assessment is recommended for women who have family members with BRCA-related cancers. BRCA-related cancers include breast, ovarian, tubal, and peritoneal cancers. Having family members with these cancers may be associated with an increased risk for harmful changes (mutations) in the breast cancer genes BRCA1 and BRCA2. Results of the assessment will determine the need for genetic counseling and BRCA1 and BRCA2 testing. Routine pelvic exams to screen for cancer are no longer recommended for nonpregnant women who are considered low risk for cancer of the pelvic organs (ovaries, uterus, and vagina) and who do not have symptoms. Ask your health care provider if a  screening pelvic exam is right for you. If you have had past treatment for cervical cancer or a condition that could lead to cancer, you need Pap tests and screening for cancer for at least 20 years after your treatment. If Pap tests have been discontinued, your risk factors (such as having a new sexual partner) need to be reassessed to determine if screening should be resumed. Some women have medical problems that increase the chance of getting cervical cancer. In these cases, your health care provider may recommend more frequent screening and Pap tests. Colorectal cancer can be detected and often prevented. Most routine colorectal  cancer screening begins at the age of 10 years and continues through age 19 years. However, your health care provider may recommend screening at an earlier age if you have risk factors for colon cancer. On a yearly basis, your health care provider may provide home test kits to check for hidden blood in the stool. Use of a small camera at the end of a tube, to directly examine the colon (sigmoidoscopy or colonoscopy), can detect the earliest forms of colorectal cancer. Talk to your health care provider about this at age 28, when routine screening begins.  Direct exam of the colon should be repeated every 5-10 years through age 71 years, unless early forms of pre-cancerous polyps or small growths are found. Hepatitis C blood testing is recommended for all people born from [birth year] through 1965 and any individual with known risks for hepatitis C.  Osteoporosis is a disease in which the bones lose minerals and strength with aging. This can result in serious bone fractures or breaks. The risk of osteoporosis can be identified using a bone density scan. Women ages 21 years and over and women at risk for fractures or osteoporosis should discuss screening with their health care providers. Ask your health care provider whether you should take a calcium supplement or vitamin D to reduce the rate  of osteoporosis. Menopause can be associated with physical symptoms and risks. Hormone replacement therapy is available to decrease symptoms and risks. You should talk to your health care provider about whether hormone replacement therapy is right for you. Use sunscreen. Apply sunscreen liberally and repeatedly throughout the day. You should seek shade when your shadow is shorter than you. Protect yourself by wearing long sleeves, pants, a wide-brimmed hat, and sunglasses year round, whenever you are outdoors. Once a month, do a whole body skin exam, using a mirror to look at the skin on your back. Tell your health care provider of new moles, moles that have irregular borders, moles that are larger than a pencil eraser, or moles that have changed in shape or color. Stay current with required vaccines (immunizations). Influenza vaccine. All adults should be immunized every year. Tetanus, diphtheria, and acellular pertussis (Td, Tdap) vaccine. Pregnant women should receive 1 dose of Tdap vaccine during each pregnancy. The dose should be obtained regardless of the length of time since the last dose. Immunization is preferred during the 27th-36th week of gestation. An adult who has not previously received Tdap or who does not know her vaccine status should receive 1 dose of Tdap. This initial dose should be followed by tetanus and diphtheria toxoids (Td) booster doses every 10 years. Adults with an unknown or incomplete history of completing a 3-dose immunization series with Td-containing vaccines should begin or complete a primary immunization series including a Tdap dose. Adults should receive a Td booster every 10 years. Varicella vaccine. An adult without evidence of immunity to varicella should receive 2 doses or a second dose if she has previously received 1 dose. Pregnant females who do not have evidence of immunity should receive the first dose after pregnancy. This first dose should be obtained before  leaving the health care facility. The second dose should be obtained 4-8 weeks after the first dose. Human papillomavirus (HPV) vaccine. Females aged 13-26 years who have not received the vaccine previously should obtain the 3-dose series. The vaccine is not recommended for use in pregnant females. However, pregnancy testing is not needed before receiving a dose. If a female is found to  be pregnant after receiving a dose, no treatment is needed. In that case, the remaining doses should be delayed until after the pregnancy. Immunization is recommended for any person with an immunocompromised condition through the age of 55 years if she did not get any or all doses earlier. During the 3-dose series, the second dose should be obtained 4-8 weeks after the first dose. The third dose should be obtained 24 weeks after the first dose and 16 weeks after the second dose. Zoster vaccine. One dose is recommended for adults aged 15 years or older unless certain conditions are present. Measles, mumps, and rubella (MMR) vaccine. Adults born before [birth year] generally are considered immune to measles and mumps. Adults born in [birth year] or later should have 1 or more doses of MMR vaccine unless there is a contraindication to the vaccine or there is laboratory evidence of immunity to each of the three diseases. A routine second dose of MMR vaccine should be obtained at least 28 days after the first dose for students attending postsecondary schools, health care workers, or international travelers. People who received inactivated measles vaccine or an unknown type of measles vaccine during 1963-1967 should receive 2 doses of MMR vaccine. People who received inactivated mumps vaccine or an unknown type of mumps vaccine before 1979 and are at high risk for mumps infection should consider immunization with 2 doses of MMR vaccine. For females of childbearing age, rubella immunity should be determined. If there is no evidence of immunity, females  who are not pregnant should be vaccinated. If there is no evidence of immunity, females who are pregnant should delay immunization until after pregnancy. Unvaccinated health care workers born before [birth year] who lack laboratory evidence of measles, mumps, or rubella immunity or laboratory confirmation of disease should consider measles and mumps immunization with 2 doses of MMR vaccine or rubella immunization with 1 dose of MMR vaccine. Pneumococcal 13-valent conjugate (PCV13) vaccine. When indicated, a person who is uncertain of her immunization history and has no record of immunization should receive the PCV13 vaccine. An adult aged 4 years or older who has certain medical conditions and has not been previously immunized should receive 1 dose of PCV13 vaccine. This PCV13 should be followed with a dose of pneumococcal polysaccharide (PPSV23) vaccine. The PPSV23 vaccine dose should be obtained at least 1 or more year(s) after the dose of PCV13 vaccine. An adult aged 15 years or older who has certain medical conditions and previously received 1 or more doses of PPSV23 vaccine should receive 1 dose of PCV13. The PCV13 vaccine dose should be obtained 1 or more years after the last PPSV23 vaccine dose.  Pneumococcal polysaccharide (PPSV23) vaccine. When PCV13 is also indicated, PCV13 should be obtained first. All adults aged 9 years and older should be immunized. An adult younger than age 14 years who has certain medical conditions should be immunized. Any person who resides in a nursing home or long-term care facility should be immunized. An adult smoker should be immunized. People with an immunocompromised condition and certain other conditions should receive both PCV13 and PPSV23 vaccines. People with human immunodeficiency virus (HIV) infection should be immunized as soon as possible after diagnosis. Immunization during chemotherapy or radiation therapy should be avoided. Routine use of PPSV23 vaccine is not  recommended for American Indians, [place] Natives, or people younger than 65 years unless there are medical conditions that require PPSV23 vaccine. When indicated, people who have unknown immunization and have no record of immunization should receive  PPSV23 vaccine. One-time revaccination 5 years after the first dose of PPSV23 is recommended for people aged 19-64 years who have chronic kidney failure, nephrotic syndrome, asplenia, or immunocompromised conditions. People who received 1-2 doses of PPSV23 before age 36 years should receive another dose of PPSV23 vaccine at age 52 years or later if at least 5 years have passed since the previous dose. Doses of PPSV23 are not needed for people immunized with PPSV23 at or after age 34 years.  Preventive Services / Frequency  Ages 74 to 1 years Blood pressure check. Lipid and cholesterol check. Lung cancer screening. / Every year if you are aged 53-80 years and have a 30-pack-year history of smoking and currently smoke or have quit within the past 15 years. Yearly screening is stopped once you have quit smoking for at least 15 years or develop a health problem that would prevent you from having lung cancer treatment. Clinical breast exam.** / Every year after age 16 years.  BRCA-related cancer risk assessment.** / For women who have family members with a BRCA-related cancer (breast, ovarian, tubal, or peritoneal cancers). Mammogram.** / Every year beginning at age 26 years and continuing for as long as you are in good health. Consult with your health care provider. Pap test.** / Every 3 years starting at age 19 years through age 52 or 29 years with a history of 3 consecutive normal Pap tests. HPV screening.** / Every 3 years from ages 18 years through ages 4 to 110 years with a history of 3 consecutive normal Pap tests. Fecal occult blood test (FOBT) of stool. / Every year beginning at age 70 years and continuing until age 53 years. You may not need to do this  test if you get a colonoscopy every 10 years. Flexible sigmoidoscopy or colonoscopy.** / Every 5 years for a flexible sigmoidoscopy or every 10 years for a colonoscopy beginning at age 66 years and continuing until age 44 years. Hepatitis C blood test.** / For all people born from [birth year] through 1967 and any individual with known risks for hepatitis C. Skin self-exam. / Monthly. Influenza vaccine. / Every year. Tetanus, diphtheria, and acellular pertussis (Tdap/Td) vaccine.** / Consult your health care provider. Pregnant women should receive 1 dose of Tdap vaccine during each pregnancy. 1 dose of Td every 10 years. Varicella vaccine.** / Consult your health care provider. Pregnant females who do not have evidence of immunity should receive the first dose after pregnancy. Zoster vaccine.** / 1 dose for adults aged 90 years or older. Pneumococcal 13-valent conjugate (PCV13) vaccine.** / Consult your health care provider. Pneumococcal polysaccharide (PPSV23) vaccine.** / 1 to 2 doses if you smoke cigarettes or if you have certain conditions. Meningococcal vaccine.** / Consult your health care provider. Hepatitis A vaccine.** / Consult your health care provider. Hepatitis B vaccine.** / Consult your health care provider. Screening for abdominal aortic aneurysm (AAA)  by ultrasound is recommended for people over 50 who have history of high blood pressure or who are current or former smokers. ++++++++++++++++++ Recommend Adult Low Dose Aspirin or  coated  Aspirin 81 mg daily  To reduce risk of Colon Cancer 40 %,  Skin Cancer 26 % ,  Melanoma 46%  and  Pancreatic cancer 60% +++++++++++++++++++ Vitamin D goal  is between 70-100.  Please make sure that you are taking your Vitamin D as directed.  It is very important as a natural anti-inflammatory  helping hair, skin, and nails, as well as reducing stroke and heart  attack risk.  It helps your bones and helps with mood. It also decreases numerous  cancer risks so please take it as directed.  Low Vit D is associated with a 200-300% higher risk for CANCER  and 200-300% higher risk for HEART   ATTACK  &  STROKE.   .....................................Marland Kitchen It is also associated with higher death rate at younger ages,  autoimmune diseases like Rheumatoid arthritis, Lupus, Multiple Sclerosis.    Also many other serious conditions, like depression, Alzheimer's Dementia, infertility, muscle aches, fatigue, fibromyalgia - just to name a few. ++++++++++++++++++ Recommend the book "The END of DIETING" by Dr Excell Seltzer  & the book "The END of DIABETES " by Dr Excell Seltzer At [email or web address] - get book & Audio CD's    Being diabetic has a  300% increased risk for heart attack, stroke, cancer, and alzheimer- type vascular dementia. It is very important that you work harder with diet by avoiding all foods that are white. Avoid white rice (brown & wild rice is OK), white potatoes (sweetpotatoes in moderation is OK), White bread or wheat bread or anything made out of white flour like bagels, donuts, rolls, buns, biscuits, cakes, pastries, cookies, pizza crust, and pasta (made from white flour & egg whites) - vegetarian pasta or spinach or wheat pasta is OK. Multigrain breads like Arnold's or [employer], or multigrain sandwich thins or flatbreads.  Diet, exercise and weight loss can reverse and cure diabetes in the early stages.  Diet, exercise and weight loss is very important in the control and prevention of complications of diabetes which affects every system in your body, ie. Brain - dementia/stroke, eyes - glaucoma/blindness, heart - heart attack/heart failure, kidneys - dialysis, stomach - gastric paralysis, intestines - malabsorption, nerves - severe painful neuritis, circulation - gangrene & loss of a leg(s), and finally cancer and Alzheimers.    I recommend avoid fried & greasy foods,  sweets/candy, white rice (brown or wild rice or Quinoa is OK), white  potatoes (sweet potatoes are OK) - anything made from white flour - bagels, doughnuts, rolls, buns, biscuits,white and wheat breads, pizza crust and traditional pasta made of white flour & egg white(vegetarian pasta or spinach or wheat pasta is OK).  Multi-grain bread is OK - like multi-grain flat bread or sandwich thins. Avoid alcohol in excess. Exercise is also important.    Eat all the vegetables you want - avoid meat, especially red meat and dairy - especially cheese.  Cheese is the most concentrated form of trans-fats which is the worst thing to clog up our arteries. Veggie cheese is OK which can be found in the fresh produce section at [employer] or [employer] or [employer]  ++++++++++++++++++++++ DASH Eating Plan  DASH stands for "Dietary Approaches to Stop Hypertension."   The DASH eating plan is a healthy eating plan that has been shown to reduce high blood pressure (hypertension). Additional health benefits may include reducing the risk of type 2 diabetes mellitus, heart disease, and stroke. The DASH eating plan may also help with weight loss. WHAT DO I NEED TO KNOW ABOUT THE DASH EATING PLAN? For the DASH eating plan, you will follow these general guidelines: Choose foods with a percent daily value for sodium of less than 5% (as listed on the food label). Use salt-free seasonings or herbs instead of table salt or sea salt. Check with your health care provider or pharmacist before using salt substitutes. Eat lower-sodium products, often labeled as "lower sodium" or "no  salt added." Eat fresh foods. Eat more vegetables, fruits, and low-fat dairy products. Choose whole grains. Look for the word "whole" as the first word in the ingredient list. Choose fish  Limit sweets, desserts, sugars, and sugary drinks. Choose heart-healthy fats. Eat veggie cheese  Eat more home-cooked food and less restaurant, buffet, and fast food. Limit fried foods. Cook foods using methods other than  frying. Limit canned vegetables. If you do use them, rinse them well to decrease the sodium. When eating at a restaurant, ask that your food be prepared with less salt, or no salt if possible.                      WHAT FOODS CAN I EAT? Read Dr Fara Olden Fuhrman's books on The End of Dieting & The End of Diabetes  Grains Whole grain or whole wheat bread. Brown rice. Whole grain or whole wheat pasta. Quinoa, bulgur, and whole grain cereals. Low-sodium cereals. Corn or whole wheat flour tortillas. Whole grain cornbread. Whole grain crackers. Low-sodium crackers.  Vegetables Fresh or frozen vegetables (raw, steamed, roasted, or grilled). Low-sodium or reduced-sodium tomato and vegetable juices. Low-sodium or reduced-sodium tomato sauce and paste. Low-sodium or reduced-sodium canned vegetables.   Fruits All fresh, canned (in natural juice), or frozen fruits.  Protein Products  All fish and seafood.  Dried beans, peas, or lentils. Unsalted nuts and seeds. Unsalted canned beans.  Dairy Low-fat dairy products, such as skim or 1% milk, 2% or reduced-fat cheeses, low-fat ricotta or cottage cheese, or plain low-fat yogurt. Low-sodium or reduced-sodium cheeses.  Fats and Oils Tub margarines without trans fats. Light or reduced-fat mayonnaise and salad dressings (reduced sodium). Avocado. Safflower, olive, or canola oils. Natural peanut or almond butter.  Other Unsalted popcorn and pretzels. The items listed above may not be a complete list of recommended foods or beverages. Contact your dietitian for more options.  ++++++++++++++++++  WHAT FOODS ARE NOT RECOMMENDED? Grains/ White flour or wheat flour White bread. White pasta. White rice. Refined cornbread. Bagels and croissants. Crackers that contain trans fat.  Vegetables  Creamed or fried vegetables. Vegetables in a . Regular canned vegetables. Regular canned tomato sauce and paste. Regular tomato and vegetable juices.  Fruits Dried fruits.  Canned fruit in light or heavy syrup. Fruit juice.  Meat and Other Protein Products Meat in general - RED meat & White meat.  Fatty cuts of meat. Ribs, chicken wings, all processed meats as bacon, sausage, bologna, salami, fatback, hot dogs, bratwurst and packaged luncheon meats.  Dairy Whole or 2% milk, cream, half-and-half, and cream cheese. Whole-fat or sweetened yogurt. Full-fat cheeses or blue cheese. Non-dairy creamers and whipped toppings. Processed cheese, cheese spreads, or cheese curds.  Condiments Onion and garlic salt, seasoned salt, table salt, and sea salt. Canned and packaged gravies. Worcestershire sauce. Tartar sauce. Barbecue sauce. Teriyaki sauce. Soy sauce, including reduced sodium. Steak sauce. Fish sauce. Oyster sauce. Cocktail sauce. Horseradish. Ketchup and mustard. Meat flavorings and tenderizers. Bouillon cubes. Hot sauce. Tabasco sauce. Marinades. Taco seasonings. Relishes.  Fats and Oils Butter, stick margarine, lard, shortening and bacon fat. Coconut, palm kernel, or palm oils. Regular salad dressings.  Pickles and olives. Salted popcorn and pretzels.  The items listed above may not be a complete list of foods and beverages to avoid.

## 2021-01-08 NOTE — Progress Notes (Signed)
Annual Screening/Preventative Visit & Comprehensive Evaluation &  Examination  Future Appointments  Date Time Provider [place]  01/08/2021  3:00 PM Unk Pinto, MD [facility] None        This very nice 50 y.o.female presents for a Screening /Preventative Visit & comprehensive evaluation and management of multiple medical co-morbidities.  Patient has been followed for HTN, HLD, T2_NIDDM  Prediabetes  and Vitamin D Deficiency.        HTN predates since     . Patient's BP has been controlled at home and patient denies any cardiac symptoms as chest pain, palpitations, shortness of breath, dizziness or ankle swelling. Today's          Patient's hyperlipidemia is controlled with diet and medications. Patient denies myalgias or other medication SE's. Last lipids were   Lab Results  Component Value Date   CHOL 197 01/03/2020   HDL 54 01/03/2020   LDLCALC 118 (H) 01/03/2020   TRIG 139 01/03/2020   CHOLHDL 3.6 01/03/2020         Patient has hx/o T2_NIDDM prediabetes predating since      and patient denies reactive hypoglycemic symptoms, visual blurring, diabetic polys or paresthesias. Last A1c was   Lab Results  Component Value Date   HGBA1C 5.8 (H) 01/03/2020         Finally, patient has history of Vitamin D Deficiency and last Vitamin D was   Lab Results  Component Value Date   VD25OH 111 (H) 01/03/2020     Current Outpatient Medications on File Prior to Visit  Medication Sig   aspirin EC 81 MG tablet Take 81 mg by mouth daily.   Cholecalciferol (VITAMIN D PO) Take 6,000 Units by mouth.     colestipol (COLESTID) 1 g tablet Take 1 tablet (1 g total) by mouth daily.   Loratadine (CLARITIN PO) Take 1 tablet by mouth daily as needed.    Norethindrone-Ethinyl Estradiol-Fe Biphas (LO LOESTRIN FE) 1 MG-10 MCG / 10 MCG tablet Take 1 tablet by mouth daily.   pantoprazole (PROTONIX) 40 MG tablet TAKE 1 TABLET BY MOUTH DAILY   propranolol ER (INDERAL LA) 160 MG SR  capsule TAKE 1 CAPSULE DAILY TO PREVENT MIGRAINES   No current facility-administered medications on file prior to visit.     Allergies  Allergen Reactions   Imitrex [Sumatriptan]     Throat swelling   Viberzi [Eluxadoline]     Throat swelling    Celexa [Citalopram]     Decreased libido   Codeine     N/V   Topamax [Topiramate]     Neurological changes   Zoloft [Sertraline Hcl]     N/V     Past Medical History:  Diagnosis Date   Allergy    Anemia    Anxiety    Depression    GERD (gastroesophageal reflux disease)    IBS (irritable bowel syndrome)    Migraines    Restless leg syndrome      Health Maintenance  Topic Date Due   COVID-19 Vaccine (1) Never done   Zoster Vaccines- Shingrix (1 of 2) Never done   PAP SMEAR-Modifier  07/13/2020   MAMMOGRAM  09/22/2020   INFLUENZA VACCINE  10/28/2020   COLONOSCOPY  01/05/2026   TETANUS/TDAP  07/21/2026   Hepatitis C Screening  Completed   HIV Screening  Completed   HPV VACCINES  Aged Out     Immunization History  Administered Date(s) Administered   Influenza Inj Mdck Quad With Preservative  01/03/2020   Influenza Split 01/14/2011, 02/16/2013, 01/10/2015   Influenza-Unspecified 02/11/2014   PPD Test 08/10/2013, 08/14/2014, 08/15/2015, 10/05/2016, 11/02/2017, 12/01/2018, 01/03/2020   Pneumococcal-Unspecified 07/29/2010   Td 03/31/2003   Tdap 08/14/2014, 07/20/2016     Last Colon - 01/06/2019 - Dr Ardis Hughs   Last MGM -    Past Surgical History:  Procedure Laterality Date   CHOLECYSTECTOMY     TERMINATION OF PREGNANC     18 WEEKS--DOWNS SYNDROME     Family History  Problem Relation Age of Onset   Diabetes Mother    Hypertension Mother    Heart attack Mother    Hypertension Father    CVA Maternal Grandfather    Colon cancer Neg Hx    Esophageal cancer Neg Hx    Stomach cancer Neg Hx    Rectal cancer Neg Hx      Social History   Tobacco Use   Smoking status: Former    Types: Cigarettes     Quit date: 03/30/1996    Years since quitting: 24.7   Smokeless tobacco: Never  Vaping Use   Vaping Use: Never used  Substance Use Topics   Alcohol use: Yes    Alcohol/week: 0.0 standard drinks    Comment: OCC   Drug use: No      ROS Constitutional: Denies fever, chills, weight loss/gain, headaches, insomnia,  night sweats, and change in appetite. Does c/o fatigue. Eyes: Denies redness, blurred vision, diplopia, discharge, itchy, watery eyes.  ENT: Denies discharge, congestion, post nasal drip, epistaxis, sore throat, earache, hearing loss, dental pain, Tinnitus, Vertigo, Sinus pain, snoring.  Cardio: Denies chest pain, palpitations, irregular heartbeat, syncope, dyspnea, diaphoresis, orthopnea, PND, claudication, edema Respiratory: denies cough, dyspnea, DOE, pleurisy, hoarseness, laryngitis, wheezing.  Gastrointestinal: Denies dysphagia, heartburn, reflux, water brash, pain, cramps, nausea, vomiting, bloating, diarrhea, constipation, hematemesis, melena, hematochezia, jaundice, hemorrhoids Genitourinary: Denies dysuria, frequency, urgency, nocturia, hesitancy, discharge, hematuria, flank pain Breast: Breast lumps, nipple discharge, bleeding.  Musculoskeletal: Denies arthralgia, myalgia, stiffness, Jt. Swelling, pain, limp, and strain/sprain. Denies falls. Skin: Denies puritis, rash, hives, warts, acne, eczema, changing in skin lesion Neuro: No weakness, tremor, incoordination, spasms, paresthesia, pain Psychiatric: Denies confusion, memory loss, sensory loss. Denies Depression. Endocrine: Denies change in weight, skin, hair change, nocturia, and paresthesia, diabetic polys, visual blurring, hyper / hypo glycemic episodes.  Heme/Lymph: No excessive bleeding, bruising, enlarged lymph nodes.  Physical Exam  There were no vitals taken for this visit.  General Appearance: Well nourished, well groomed and in no apparent distress.  Eyes: PERRLA, EOMs, conjunctiva no swelling or erythema,  normal fundi and vessels. Sinuses: No frontal/maxillary tenderness ENT/Mouth: EACs patent / TMs  nl. Nares clear without erythema, swelling, mucoid exudates. Oral hygiene is good. No erythema, swelling, or exudate. Tongue normal, non-obstructing. Tonsils not swollen or erythematous. Hearing normal.  Neck: Supple, thyroid not palpable. No bruits, nodes or JVD. Respiratory: Respiratory effort normal.  BS equal and clear bilateral without rales, rhonci, wheezing or stridor. Cardio: Heart sounds are normal with regular rate and rhythm and no murmurs, rubs or gallops. Peripheral pulses are normal and equal bilaterally without edema. No aortic or femoral bruits. Chest: symmetric with normal excursions and percussion. Breasts: Symmetric, without lumps, nipple discharge, retractions, or fibrocystic changes.  Abdomen: Flat, soft with bowel sounds active. Nontender, no guarding, rebound, hernias, masses, or organomegaly.  Lymphatics: Non tender without lymphadenopathy.  Genitourinary:  Musculoskeletal: Full ROM all peripheral extremities, joint stability, 5/5 strength, and normal gait. Skin: Warm and dry without  rashes, lesions, cyanosis, clubbing or  ecchymosis.  Neuro: Cranial nerves intact, reflexes equal bilaterally. Normal muscle tone, no cerebellar symptoms. Sensation intact.  Pysch: Alert and oriented X 3, normal affect, Insight and Judgment appropriate.    Assessment and Plan  1. Annual Preventative Screening Examination   2. Elevated BP without diagnosis of hypertension  - CBC with Differential/Platelet - COMPLETE METABOLIC PANEL WITH GFR - Magnesium - TSH  3. Elevated lipids  - EKG 12-Lead - Lipid panel - TSH  4. Abnormal glucose  - EKG 12-Lead - Hemoglobin A1c - Insulin, random  5. Vitamin D deficiency  - VITAMIN D 25 Hydroxy   6. Migraine with aura and without status migrainosus, not intractable   7. Screening examination for pulmonary tuberculosis  - TB Skin  Test  8. Screening for colorectal cancer  - POC Hemoccult Bld/Stl   9. Screening for ischemic heart disease  - EKG 12-Lead  10. FHx: heart disease  - EKG 12-Lead - [place], RETROPERITNL ABD,  LTD  11. Former smoker  - EKG 12-Lead - [place], RETROPERITNL ABD,  LTD  12. Screening for AAA (aortic abdominal aneurysm)  - [place], RETROPERITNL ABD,  LTD - TSH  13. Fatigue, unspecified type  - Iron, Total/Total Iron Binding Cap - Vitamin B12 - CBC with Differential/Platelet  14. Medication management  - Urinalysis, Routine w reflex microscopic - Microalbumin / creatinine urine ratio - CBC with Differential/Platelet - COMPLETE METABOLIC PANEL WITH GFR - Magnesium - Lipid panel - TSH - Hemoglobin A1c - Insulin, random - VITAMIN D 25 Hydroxy (Vit-D Deficiency, Fractures)  15. Need for influenza vaccination  - Flu Vaccine QUAD 6+ mos PF IM (Fluarix Quad PF)  16. Visit for TB skin test   17. Primary insomnia  - gabapentin (NEURONTIN) 100 MG capsule;  Take 1 capsule 3 x /day ay needed for Hot Flashes or Sleep   Dispense: 90 capsule; Refill: 0          Patient was counseled in prudent diet to achieve/maintain BMI less than 25 for weight control, BP monitoring, regular exercise and medications. Discussed med's effects and SE's. Screening labs and tests as requested with regular follow-up as recommended. Over 40 minutes of exam, counseling, chart review and high complex critical decision making was performed.   Kirtland Bouchard, MD

## 2021-01-09 LAB — COMPLETE METABOLIC PANEL WITH GFR
AG Ratio: 1.4 (calc) (ref 1.0–2.5)
ALT: 13 U/L (ref 6–29)
AST: 16 U/L (ref 10–35)
Albumin: 4 g/dL (ref 3.6–5.1)
Alkaline phosphatase (APISO): 60 U/L (ref 37–153)
BUN: 13 mg/dL (ref 7–25)
CO2: 26 mmol/L (ref 20–32)
Calcium: 9.4 mg/dL (ref 8.6–10.4)
Chloride: 104 mmol/L (ref 98–110)
Creat: 0.74 mg/dL (ref 0.50–1.03)
Globulin: 2.8 g/dL (calc) (ref 1.9–3.7)
Glucose, Bld: 70 mg/dL (ref 65–99)
Potassium: 4.8 mmol/L (ref 3.5–5.3)
Sodium: 140 mmol/L (ref 135–146)
Total Bilirubin: 1.5 mg/dL — ABNORMAL HIGH (ref 0.2–1.2)
Total Protein: 6.8 g/dL (ref 6.1–8.1)
eGFR: 97 mL/min/{1.73_m2} (ref >=60)

## 2021-01-09 LAB — CBC WITH DIFFERENTIAL/PLATELET
Absolute Monocytes: 462 cells/uL (ref 200–950)
Basophils Absolute: 34 cells/uL (ref 0–200)
Basophils Relative: 0.5 %
Eosinophils Absolute: 174 cells/uL (ref 15–500)
Eosinophils Relative: 2.6 %
HCT: 38.5 % (ref 35.0–45.0)
Hemoglobin: 12.7 g/dL (ref 11.7–15.5)
Lymphs Abs: 1863 cells/uL (ref 850–3900)
MCH: 31.5 pg (ref 27.0–33.0)
MCHC: 33 g/dL (ref 32.0–36.0)
MCV: 95.5 fL (ref 80.0–100.0)
MPV: 10.9 fL (ref 7.5–12.5)
Monocytes Relative: 6.9 %
Neutro Abs: 4167 cells/uL (ref 1500–7800)
Neutrophils Relative %: 62.2 %
Platelets: 249 10*3/uL (ref 140–400)
RBC: 4.03 10*6/uL (ref 3.80–5.10)
RDW: 11.4 % (ref 11.0–15.0)
Total Lymphocyte: 27.8 %
WBC: 6.7 10*3/uL (ref 3.8–10.8)

## 2021-01-09 LAB — MAGNESIUM: Magnesium: 2 mg/dL (ref 1.5–2.5)

## 2021-01-09 LAB — LIPID PANEL
Cholesterol: 168 mg/dL (ref <200)
HDL: 52 mg/dL (ref >=50)
LDL Cholesterol (Calc): 96 mg/dL (calc)
Non-HDL Cholesterol (Calc): 116 mg/dL (calc) (ref <130)
Total CHOL/HDL Ratio: 3.2 (calc) (ref <5.0)
Triglycerides: 104 mg/dL (ref <150)

## 2021-01-09 LAB — URINALYSIS, ROUTINE W REFLEX MICROSCOPIC
Bilirubin Urine: NEGATIVE
Glucose, UA: NEGATIVE
Hgb urine dipstick: NEGATIVE
Ketones, ur: NEGATIVE
Leukocytes,Ua: NEGATIVE
Nitrite: NEGATIVE
Protein, ur: NEGATIVE
Specific Gravity, Urine: 1.006 (ref 1.001–1.035)
pH: 5.5 (ref 5.0–8.0)

## 2021-01-09 LAB — MICROALBUMIN / CREATININE URINE RATIO
Creatinine, Urine: 38 mg/dL (ref 20–275)
Microalb, Ur: <0.2 mg/dL

## 2021-01-09 LAB — HEMOGLOBIN A1C
Hgb A1c MFr Bld: 5.3 % of total Hgb (ref <5.7)
Mean Plasma Glucose: 105 mg/dL
eAG (mmol/L): 5.8 mmol/L

## 2021-01-09 LAB — TSH: TSH: 1.78 mIU/L

## 2021-01-09 LAB — IRON, TOTAL/TOTAL IRON BINDING CAP
%SAT: 37 % (calc) (ref 16–45)
Iron: 123 ug/dL (ref 45–160)
TIBC: 330 mcg/dL (calc) (ref 250–450)

## 2021-01-09 LAB — VITAMIN B12: Vitamin B-12: >2000 pg/mL — ABNORMAL HIGH (ref 200–1100)

## 2021-01-09 LAB — VITAMIN D 25 HYDROXY (VIT D DEFICIENCY, FRACTURES): Vit D, 25-Hydroxy: 92 ng/mL (ref 30–100)

## 2021-01-09 LAB — INSULIN, RANDOM: Insulin: 1.4 u[IU]/mL

## 2021-01-09 NOTE — Progress Notes (Signed)
============================================================ -   Test results slightly outside the reference range are not unusual. If there is anything important, I will review this with you,  otherwise it is considered normal test values.  If you have further questions,  please do not hesitate to contact me at the office or via My Chart.  ============================================================ ============================================================  -  Iron & Vitamin B12 levels - Normal  ============================================================ ============================================================  - Total chol = 168    &  LDL Chol = 96   -   Both  Excellent   - Very low risk for Heart Attack  / Stroke ============================================================ ============================================================  -  A1c back to Normal - No Diabetes - Great ! ============================================================ ============================================================  - Vitamin D = 92 - Excellent  ============================================================ ============================================================  - All Else - CBC - Kidneys - Electrolytes - Liver - Magnesium & Thyroid    - all  Normal / OK  ============================================================ ============================================================

## 2021-02-14 ENCOUNTER — Encounter: Payer: Self-pay | Admitting: Internal Medicine

## 2021-02-15 ENCOUNTER — Other Ambulatory Visit: Payer: Self-pay | Admitting: Internal Medicine

## 2021-02-15 MED ORDER — GABAPENTIN 300 MG PO CAPS: ORAL_CAPSULE | ORAL | 1 refills | Status: DC

## 2021-10-24 ENCOUNTER — Other Ambulatory Visit: Payer: Self-pay | Admitting: Gastroenterology

## 2021-12-09 DIAGNOSIS — Z6826 Body mass index (BMI) 26.0-26.9, adult: Secondary | ICD-10-CM | POA: Diagnosis not present

## 2021-12-09 DIAGNOSIS — Z01419 Encounter for gynecological examination (general) (routine) without abnormal findings: Secondary | ICD-10-CM | POA: Diagnosis not present

## 2021-12-09 DIAGNOSIS — Z1231 Encounter for screening mammogram for malignant neoplasm of breast: Secondary | ICD-10-CM | POA: Diagnosis not present

## 2021-12-09 DIAGNOSIS — Z1151 Encounter for screening for human papillomavirus (HPV): Secondary | ICD-10-CM | POA: Diagnosis not present

## 2021-12-09 DIAGNOSIS — Z124 Encounter for screening for malignant neoplasm of cervix: Secondary | ICD-10-CM | POA: Diagnosis not present

## 2022-01-01 ENCOUNTER — Ambulatory Visit (INDEPENDENT_AMBULATORY_CARE_PROVIDER_SITE_OTHER): Payer: BC Managed Care – PPO | Admitting: Nurse Practitioner

## 2022-01-01 ENCOUNTER — Encounter: Payer: Self-pay | Admitting: Nurse Practitioner

## 2022-01-01 VITALS — BP 118/80 | HR 69 | Temp 97.7°F | Ht 63.5 in | Wt 146.4 lb

## 2022-01-01 DIAGNOSIS — R7303 Prediabetes: Secondary | ICD-10-CM

## 2022-01-01 DIAGNOSIS — Z79899 Other long term (current) drug therapy: Secondary | ICD-10-CM | POA: Diagnosis not present

## 2022-01-01 DIAGNOSIS — E559 Vitamin D deficiency, unspecified: Secondary | ICD-10-CM | POA: Diagnosis not present

## 2022-01-01 DIAGNOSIS — R52 Pain, unspecified: Secondary | ICD-10-CM

## 2022-01-01 DIAGNOSIS — Z136 Encounter for screening for cardiovascular disorders: Secondary | ICD-10-CM | POA: Diagnosis not present

## 2022-01-01 DIAGNOSIS — I10 Essential (primary) hypertension: Secondary | ICD-10-CM | POA: Diagnosis not present

## 2022-01-01 DIAGNOSIS — K580 Irritable bowel syndrome with diarrhea: Secondary | ICD-10-CM

## 2022-01-01 DIAGNOSIS — Z1211 Encounter for screening for malignant neoplasm of colon: Secondary | ICD-10-CM

## 2022-01-01 DIAGNOSIS — Z0000 Encounter for general adult medical examination without abnormal findings: Secondary | ICD-10-CM | POA: Diagnosis not present

## 2022-01-01 DIAGNOSIS — Z13220 Encounter for screening for lipoid disorders: Secondary | ICD-10-CM

## 2022-01-01 DIAGNOSIS — G43801 Other migraine, not intractable, with status migrainosus: Secondary | ICD-10-CM

## 2022-01-01 DIAGNOSIS — Z1389 Encounter for screening for other disorder: Secondary | ICD-10-CM | POA: Diagnosis not present

## 2022-01-01 DIAGNOSIS — Z131 Encounter for screening for diabetes mellitus: Secondary | ICD-10-CM | POA: Diagnosis not present

## 2022-01-01 DIAGNOSIS — Z1329 Encounter for screening for other suspected endocrine disorder: Secondary | ICD-10-CM

## 2022-01-01 DIAGNOSIS — K219 Gastro-esophageal reflux disease without esophagitis: Secondary | ICD-10-CM

## 2022-01-01 DIAGNOSIS — R232 Flushing: Secondary | ICD-10-CM

## 2022-01-01 DIAGNOSIS — Z0001 Encounter for general adult medical examination with abnormal findings: Secondary | ICD-10-CM

## 2022-01-01 NOTE — Patient Instructions (Addendum)
Black Cohosh--natural supplement. Veozah--prescription medication.   Antinuclear Antibody Test Why am I having this test? This is a test that is used to help diagnose systemic lupus erythematosus (SLE) and other autoimmune diseases. An autoimmune disease is a disease in which the body's own defense system (immune system) attacks its organs. What is being tested? This test checks for antinuclear antibodies (ANA) in the blood. The presence of ANA is associated with several autoimmune diseases. It is seen in almost all people with lupus. What kind of sample is taken?  A blood sample is required for this test. It is usually collected by inserting a needle into a blood vessel. How are the results reported? Your test results will be reported as either positive or negative. What do the results mean? A positive test result may mean that you have: Lupus. Other autoimmune diseases, such as rheumatoid arthritis, scleroderma, or Sjgren syndrome. Talk with your health care provider about what your results mean. In some cases, your health care provider may do more testing to confirm the results. More testing may be done because other conditions can sometimes cause a positive result, such as: Liver dysfunction. Myasthenia gravis. Infectious mononucleosis. Questions to ask your health care provider Ask your health care provider, or the department that is doing the test: When will my results be ready? How will I get my results? What are my treatment options? What other tests do I need? What are my next steps? Summary This is a test that is used to help diagnose systemic lupus erythematosus (SLE) and other autoimmune diseases. An autoimmune disease is a disease in which the body's own defense system (immune system) attacks the body. This test checks for antinuclear antibodies (ANA) in the blood. The presence of ANA is associated with several autoimmune diseases. It is seen in almost all people with  lupus. Your test results will be reported as either positive or negative. Talk with your health care provider about what your results mean. This information is not intended to replace advice given to you by your health care provider. Make sure you discuss any questions you have with your health care provider. Document Revised: 11/17/2020 Document Reviewed: 11/17/2020 Elsevier Patient Education  [place].  **For treatment of hot flashes**

## 2022-01-01 NOTE — Progress Notes (Signed)
Complete Physical  Assessment and Plan:   Other migraine with status migrainosus, not intractable Improved, resolved. Stay well hydrated. Continue to monitor   Gastroesophageal reflux disease, unspecified whether esophagitis present Continue pantoprazole PRN Follow Dr. Ardis Hughs, GI No suspected reflux complications (Barret/stricture). Lifestyle modification:  wt loss, avoid meals 2-3h before bedtime. Consider eliminating food triggers:  chocolate, caffeine, EtOH, acid/spicy food.  Irritable bowel syndrome with diarrhea Follows with Dr. Ardis Hughs, GI Continue Colestipol takes PRN  Vitamin D deficiency Continue supplement Monitor levels  Prediabetes Education: Reviewed 'ABCs' of diabetes management  Discussed goals to be met and/or maintained include A1C (<7) Blood pressure (<130/80) Cholesterol (LDL <70) Continue Eye Exam yearly  Continue Dental Exam Q6 mo Discussed dietary recommendations Discussed Physical Activity recommendations Check A1C  Medication management All medications discussed and reviewed in full. All questions and concerns regarding medications addressed.     Screening for colorectal cancer Colonoscopy 2020 with 7 year recall Due 2027  Screening for ischemic heart disease EKG  Screening for thyroid disorder TSH  Screening for cholesterol level Lipid panel  Screening for hematuria or proteinuria UA  Generalized pain Will check ESR, CRP, ANA Possible menopausal contributor Continue to monitor Refer if s/s fail to improve or negative work up in clinic.  Hot flashes Suggest [place] [employer] herbal supplement May do research on [employer] Continue to follow with GYN  Orders Placed This Encounter  Procedures   CBC with Differential/Platelet   COMPLETE METABOLIC PANEL WITH GFR   Magnesium   Lipid panel   TSH   Hemoglobin A1c   Insulin, random   VITAMIN D 25 Hydroxy (Vit-D Deficiency, Fractures)   Urinalysis, Routine w reflex microscopic    Sedimentation rate   ANA   C-reactive protein   EKG 12-Lead    Future Appointments  Date Time Provider [place]  01/04/2023 10:00 AM Unk Pinto, MD [facility] None    HPI  51 y.o. female  presents for a complete physical. She has GERD (gastroesophageal reflux disease); IBS (irritable bowel syndrome); Migraines; Vitamin D deficiency; Screening for colorectal cancer; and Prediabetes on their problem list.  Feels as though she is going through menopause.  She has noticed increase in hot flashes and joint pain, insomnia.  She feels as though it is harder to get dressed in the mornings.  She reports most notieably over the weekend, the pain was more significant.  Unable to put on shirt without pain,  unable to take off bra.  Most notably in the right hip, has trouble laying on that area. She has noticed moodieness, wanting to cry, the pain has become more significant since 03/2021.  She did the blood work with GYN in 05/2021 to confirm menopause.  She had pap amd mammogram 11/2021. All normal.    She had Covid 2021 but not recent.  She is not vaccinated   BMI is Body mass index is 25.53 kg/m., she has been working on diet and exercise. Wt Readings from Last 3 Encounters:  01/01/22 146 lb 6.4 oz (66.4 kg)  01/08/21 137 lb 6.4 oz (62.3 kg)  10/04/20 136 lb (61.7 kg)   Her blood pressure has been controlled at home, today their BP is BP: 118/80 She does workout. She denies chest pain, shortness of breath, dizziness.   She is not on cholesterol medication and denies myalgias. Her cholesterol is at goal. The cholesterol last visit was:   Lab Results  Component Value Date   CHOL 168 01/08/2021   HDL 52  01/08/2021   LDLCALC 96 01/08/2021   TRIG 104 01/08/2021   CHOLHDL 3.2 01/08/2021   She has been working on diet and exercise for prediabetes, she is on bASA, she is not on ACE/ARB and denies polydipsia and polyuria. Last A1C in the office was:  Lab Results  Component Value Date    HGBA1C 5.3 01/08/2021   Last GFR: Lab Results  Component Value Date   EGFR 97 01/08/2021   Patient is on Vitamin D supplement.   Lab Results  Component Value Date   VD25OH 92 01/08/2021      Current Medications:  Current Outpatient Medications on File Prior to Visit  Medication Sig Dispense Refill   aspirin EC 81 MG tablet Take 81 mg by mouth daily.     Cholecalciferol (VITAMIN D PO) Take 6,000 Units by mouth.       colestipol (COLESTID) 1 g tablet TAKE 1 TABLET BY MOUTH DAILY. 30 tablet 11   Loratadine (CLARITIN PO) Take 1 tablet by mouth daily as needed.      pantoprazole (PROTONIX) 40 MG tablet TAKE 1 TABLET BY MOUTH DAILY 30 tablet 11   gabapentin (NEURONTIN) 300 MG capsule Take  1 capsule  3 x /day  as needed for Hot Flashes or Sleep (Patient not taking: Reported on 01/01/2022) 270 capsule 1   Norethindrone-Ethinyl Estradiol-Fe Biphas (LO LOESTRIN FE) 1 MG-10 MCG / 10 MCG tablet Take 1 tablet by mouth daily.     propranolol ER (INDERAL LA) 160 MG SR capsule TAKE 1 CAPSULE DAILY TO PREVENT MIGRAINES (Patient not taking: Reported on 01/01/2022) 90 capsule 3   No current facility-administered medications on file prior to visit.   Allergies:  Allergies  Allergen Reactions   Imitrex [Sumatriptan]     Throat swelling   Viberzi [Eluxadoline]     Throat swelling    Celexa [Citalopram]     Decreased libido   Codeine     N/V   Topamax [Topiramate]     Neurological changes   Zoloft [Sertraline Hcl]     N/V   Medical History:  She has GERD (gastroesophageal reflux disease); IBS (irritable bowel syndrome); Migraines; Vitamin D deficiency; Screening for colorectal cancer; and Prediabetes on their problem list.  Health Maintenance:   Immunization History  Administered Date(s) Administered   Influenza Inj Mdck Quad With Preservative 01/03/2020   Influenza Split 01/14/2011, 02/16/2013, 01/10/2015   Influenza,inj,Quad PF,6+ Mos 01/08/2021   Influenza-Unspecified 02/11/2014    PPD Test 08/10/2013, 08/14/2014, 08/15/2015, 10/05/2016, 11/02/2017, 12/01/2018, 01/03/2020, 01/08/2021   Pneumococcal-Unspecified 07/29/2010   Td 03/31/2003   Tdap 08/14/2014, 07/20/2016   Health Maintenance  Topic Date Due   COVID-19 Vaccine (1) Never done   Zoster Vaccines- Shingrix (1 of 2) Never done   PAP SMEAR-Modifier  07/13/2020   MAMMOGRAM  09/22/2020   INFLUENZA VACCINE  10/28/2021   COLONOSCOPY (Pts 45-43yr Insurance coverage will need to be confirmed)  01/05/2026   TETANUS/TDAP  07/21/2026   Hepatitis C Screening  Completed   HIV Screening  Completed   HPV VACCINES  Aged Out    LMP: Patient's last menstrual period was 01/05/2019 (exact date). Sexually Active: yes STD testing offered Pap: MGM:  DEXA:  Colonoscopy: EGD:  Last Dental Exam: Last Eye Exam: Last Derm Exam:   Patient Care Team: MUnk Pinto MD as PCP - General (Internal Medicine)  Surgical History:  [facility] a [facility] Family History:  Herfamily history includes  CVA in her maternal grandfather; Diabetes in her mother; Heart attack in her mother; Hypertension in her father and mother. Social History:  She reports that she quit smoking about 25 years ago. Her smoking use included cigarettes. She has never used smokeless tobacco. She reports current alcohol use. She reports that she does not use drugs.  Review of Systems: ROS  Physical Exam: Estimated body mass index is 25.53 kg/m as calculated from the following:   Height as of this encounter: 5' 3.5" (1.613 m).   Weight as of this encounter: 146 lb 6.4 oz (66.4 kg). BP 118/80   Pulse 69   Temp 97.7 F (36.5 C)   Ht 5' 3.5" (1.613 m)   Wt 146 lb 6.4 oz (66.4 kg)   LMP 01/05/2019 (Exact Date)   SpO2 97%   BMI 25.53 kg/m   General Appearance: Well nourished, well developed, in no apparent distress.  Eyes: PERRLA, EOMs, conjunctiva no swelling or erythema,  normal fundi and vessels.  Sinuses: No Frontal/maxillary tenderness  ENT/Mouth: Ext aud canals clear, normal light reflex with TMs without erythema, bulging. Good dentition. No erythema, swelling, or exudate on post pharynx. Tonsils not swollen or erythematous. Hearing normal.  Neck: Supple, thyroid normal. No bruits  Respiratory: Respiratory effort normal, BS equal bilaterally without rales, rhonchi, wheezing or stridor.  Cardio: RRR without murmurs, rubs or gallops. Brisk peripheral pulses without edema.  Chest: symmetric, with normal excursions and percussion.  Breasts: Symmetric, without lumps, nipple discharge, retractions.  Abdomen: Soft, nontender, no guarding, rebound, hernias, masses, or organomegaly.  Lymphatics: Non tender without lymphadenopathy.  Musculoskeletal: Full ROM all peripheral extremities,5/5 strength, and normal gait.  Skin: Warm, dry without rashes, lesions, ecchymosis. Neuro: Cranial nerves intact, reflexes equal bilaterally. Normal muscle tone, no cerebellar symptoms. Sensation intact.  Psych: Awake and oriented X 3, normal affect, Insight and Judgment appropriate.  Genitourinary: Female genitalia: not done - defer.    EKG: WNL no ST changes. AORTA SCAN: WNL   Darrol Jump, NP 2:11 PM [facility]

## 2022-01-02 LAB — CBC WITH DIFFERENTIAL/PLATELET
Absolute Monocytes: 411 cells/uL (ref 200–950)
Basophils Absolute: 21 cells/uL (ref 0–200)
Basophils Relative: 0.4 %
Eosinophils Absolute: 172 cells/uL (ref 15–500)
Eosinophils Relative: 3.3 %
HCT: 37.5 % (ref 35.0–45.0)
Hemoglobin: 12.6 g/dL (ref 11.7–15.5)
Lymphs Abs: 1591 cells/uL (ref 850–3900)
MCH: 31.7 pg (ref 27.0–33.0)
MCHC: 33.6 g/dL (ref 32.0–36.0)
MCV: 94.2 fL (ref 80.0–100.0)
MPV: 10.3 fL (ref 7.5–12.5)
Monocytes Relative: 7.9 %
Neutro Abs: 3006 cells/uL (ref 1500–7800)
Neutrophils Relative %: 57.8 %
Platelets: 231 10*3/uL (ref 140–400)
RBC: 3.98 10*6/uL (ref 3.80–5.10)
RDW: 11.8 % (ref 11.0–15.0)
Total Lymphocyte: 30.6 %
WBC: 5.2 10*3/uL (ref 3.8–10.8)

## 2022-01-02 LAB — URINALYSIS, ROUTINE W REFLEX MICROSCOPIC
Bilirubin Urine: NEGATIVE
Glucose, UA: NEGATIVE
Hgb urine dipstick: NEGATIVE
Ketones, ur: NEGATIVE
Leukocytes,Ua: NEGATIVE
Nitrite: NEGATIVE
Protein, ur: NEGATIVE
Specific Gravity, Urine: 1.012 (ref 1.001–1.035)
pH: 6 (ref 5.0–8.0)

## 2022-01-02 LAB — LIPID PANEL
Cholesterol: 186 mg/dL (ref <200)
HDL: 81 mg/dL (ref >=50)
LDL Cholesterol (Calc): 90 mg/dL (calc)
Non-HDL Cholesterol (Calc): 105 mg/dL (calc) (ref <130)
Total CHOL/HDL Ratio: 2.3 (calc) (ref <5.0)
Triglycerides: 67 mg/dL (ref <150)

## 2022-01-02 LAB — COMPLETE METABOLIC PANEL WITH GFR
AG Ratio: 1.9 (calc) (ref 1.0–2.5)
ALT: 19 U/L (ref 6–29)
AST: 25 U/L (ref 10–35)
Albumin: 4.5 g/dL (ref 3.6–5.1)
Alkaline phosphatase (APISO): 105 U/L (ref 37–153)
BUN: 13 mg/dL (ref 7–25)
CO2: 28 mmol/L (ref 20–32)
Calcium: 9.7 mg/dL (ref 8.6–10.4)
Chloride: 104 mmol/L (ref 98–110)
Creat: 0.73 mg/dL (ref 0.50–1.03)
Globulin: 2.4 g/dL (calc) (ref 1.9–3.7)
Glucose, Bld: 77 mg/dL (ref 65–99)
Potassium: 4.1 mmol/L (ref 3.5–5.3)
Sodium: 142 mmol/L (ref 135–146)
Total Bilirubin: 0.9 mg/dL (ref 0.2–1.2)
Total Protein: 6.9 g/dL (ref 6.1–8.1)
eGFR: 98 mL/min/{1.73_m2} (ref >=60)

## 2022-01-02 LAB — C-REACTIVE PROTEIN: CRP: 9.1 mg/L — ABNORMAL HIGH (ref <8.0)

## 2022-01-02 LAB — ANA: Anti Nuclear Antibody (ANA): NEGATIVE

## 2022-01-02 LAB — MAGNESIUM: Magnesium: 2 mg/dL (ref 1.5–2.5)

## 2022-01-02 LAB — SEDIMENTATION RATE: Sed Rate: 31 mm/h — ABNORMAL HIGH (ref 0–30)

## 2022-01-02 LAB — TSH: TSH: 2.79 mIU/L

## 2022-01-02 LAB — HEMOGLOBIN A1C
Hgb A1c MFr Bld: 5.5 % of total Hgb (ref <5.7)
Mean Plasma Glucose: 111 mg/dL
eAG (mmol/L): 6.2 mmol/L

## 2022-01-02 LAB — VITAMIN D 25 HYDROXY (VIT D DEFICIENCY, FRACTURES): Vit D, 25-Hydroxy: 70 ng/mL (ref 30–100)

## 2022-01-02 LAB — INSULIN, RANDOM: Insulin: 2.4 u[IU]/mL

## 2022-01-06 ENCOUNTER — Encounter: Payer: Self-pay | Admitting: Nurse Practitioner

## 2022-01-06 DIAGNOSIS — R52 Pain, unspecified: Secondary | ICD-10-CM

## 2022-01-06 DIAGNOSIS — M25551 Pain in right hip: Secondary | ICD-10-CM

## 2022-01-07 ENCOUNTER — Other Ambulatory Visit: Payer: Self-pay | Admitting: Nurse Practitioner

## 2022-01-07 DIAGNOSIS — M25619 Stiffness of unspecified shoulder, not elsewhere classified: Secondary | ICD-10-CM

## 2022-01-07 DIAGNOSIS — G8929 Other chronic pain: Secondary | ICD-10-CM

## 2022-01-08 ENCOUNTER — Encounter: Payer: BC Managed Care – PPO | Admitting: Internal Medicine

## 2022-01-09 ENCOUNTER — Ambulatory Visit
Admission: RE | Admit: 2022-01-09 | Discharge: 2022-01-09 | Disposition: A | Discharge status: Home / Self Care | Payer: BC Managed Care – PPO | Source: Ambulatory Visit | Attending: Nurse Practitioner | Admitting: Nurse Practitioner

## 2022-01-09 DIAGNOSIS — M545 Low back pain, unspecified: Secondary | ICD-10-CM | POA: Diagnosis not present

## 2022-01-09 DIAGNOSIS — M25511 Pain in right shoulder: Secondary | ICD-10-CM | POA: Diagnosis not present

## 2022-01-09 DIAGNOSIS — G8929 Other chronic pain: Secondary | ICD-10-CM

## 2022-01-09 DIAGNOSIS — M25551 Pain in right hip: Secondary | ICD-10-CM | POA: Diagnosis not present

## 2022-01-09 DIAGNOSIS — M25619 Stiffness of unspecified shoulder, not elsewhere classified: Secondary | ICD-10-CM

## 2022-01-13 ENCOUNTER — Other Ambulatory Visit: Payer: Self-pay | Admitting: Nurse Practitioner

## 2022-01-13 DIAGNOSIS — G8929 Other chronic pain: Secondary | ICD-10-CM

## 2022-01-25 ENCOUNTER — Other Ambulatory Visit: Payer: Self-pay | Admitting: Gastroenterology

## 2022-01-26 NOTE — Telephone Encounter (Signed)
Good morning Dr Stark,  This is a patient of Dr Jacobs.  I am sending you the refill request as you are DOD am.  Please advise. 

## 2022-01-26 NOTE — Telephone Encounter (Signed)
Chart reviewed.  History of GERD, last seen 10/04/2020 by Dr. Ardis Hughs.  Reflux well controlled with pantoprazole 40 mg which she takes every other day.  Okay to renew Rx for pantoprazole 40 mg as written with 1 year supply and plan to follow-up in 09/2022.

## 2022-01-26 NOTE — Telephone Encounter (Signed)
I made a mistake Dr Cigiliano is DOD am.   Dr C,   Please advise on refills.   Thank you. 

## 2022-02-12 ENCOUNTER — Ambulatory Visit (HOSPITAL_BASED_OUTPATIENT_CLINIC_OR_DEPARTMENT_OTHER): Payer: BC Managed Care – PPO | Admitting: Physical Therapy

## 2022-03-02 ENCOUNTER — Encounter (HOSPITAL_BASED_OUTPATIENT_CLINIC_OR_DEPARTMENT_OTHER): Payer: Self-pay | Admitting: Physical Therapy

## 2022-03-02 ENCOUNTER — Ambulatory Visit (HOSPITAL_BASED_OUTPATIENT_CLINIC_OR_DEPARTMENT_OTHER): Payer: BC Managed Care – PPO | Attending: Nurse Practitioner | Admitting: Physical Therapy

## 2022-03-02 DIAGNOSIS — M545 Low back pain, unspecified: Secondary | ICD-10-CM | POA: Insufficient documentation

## 2022-03-02 DIAGNOSIS — R52 Pain, unspecified: Secondary | ICD-10-CM | POA: Diagnosis not present

## 2022-03-02 DIAGNOSIS — M6281 Muscle weakness (generalized): Secondary | ICD-10-CM | POA: Insufficient documentation

## 2022-03-02 DIAGNOSIS — R29898 Other symptoms and signs involving the musculoskeletal system: Secondary | ICD-10-CM

## 2022-03-02 DIAGNOSIS — M25551 Pain in right hip: Secondary | ICD-10-CM | POA: Insufficient documentation

## 2022-03-02 DIAGNOSIS — M5459 Other low back pain: Secondary | ICD-10-CM

## 2022-03-02 NOTE — Therapy (Signed)
**Note De-Identified Tina Obfuscation** OUTPATIENT PHYSICAL THERAPY LOWER EXTREMITY EVALUATION   Patient Name: Tina Holmes MRN: [id number] DOB:[birth year], 51 y.o., female Today's Date: 03/02/2022  END OF SESSION:  PT End of Session - 03/02/22 1559     Visit Number 1    Number of Visits 13    Date for PT Re-Evaluation 04/13/22    Authorization Type [employer]    Authorization Time Period 03/02/22 to 04/27/22    PT Start Time 1516    PT Stop Time 1558    PT Time Calculation (min) 42 min    Activity Tolerance Patient tolerated treatment well    Behavior During Therapy WFL for tasks assessed/performed             Past Medical History:  Diagnosis Date   Allergy    Anemia    Anxiety    Depression    GERD (gastroesophageal reflux disease)    IBS (irritable bowel syndrome)    Migraines    Restless leg syndrome    Past Surgical History:  Procedure Laterality Date   CHOLECYSTECTOMY     TERMINATION OF PREGNANC     18 WEEKS--DOWNS SYNDROME   Patient Active Problem List   Diagnosis Date Noted   Prediabetes 08/14/2015   Screening for colorectal cancer 08/14/2014   Vitamin D deficiency 08/10/2013   IBS (irritable bowel syndrome)    Migraines    GERD (gastroesophageal reflux disease)     PCP: Unk Pinto MD   REFERRING PROVIDER: Darrol Jump, NP  REFERRING DIAG: R52 (ICD-10-CM) - Generalized pain M25.551 (ICD-10-CM) - Right hip pain R52 (ICD-10-CM) - Pain aggravated by walking  THERAPY DIAG:  Pain in right hip  Muscle weakness (generalized)  Other low back pain  Other symptoms and signs involving the musculoskeletal system  Rationale for Evaluation and Treatment: Rehabilitation  ONSET DATE: 01/23/2022  SUBJECTIVE:   SUBJECTIVE STATEMENT: Pain started in the front of my hip about 6 months ago, I have a 53 year old Shitzu that I have to give eye drops to, I really noticed that the hip was hurting when I was trying to do this. It hurts, almost catches, when I move certain ways. I can't  sit "criss cross applesauce" anymore. Feels like a deeper pain. No locking in hip. No popping or joint sounds noted. I don't sleep on that side because the hip and R shoulder hurts, I can't sleep on my right side. One day I woke up and it just started hurting, not sure what happened. Every once in awhile I'll get tingling in my toes, noticed this maybe 2-3 times in the past couple months, seemed pretty random. My lower back has bothered me since I had my son who's 50 now.   PERTINENT HISTORY: HPI  51 y.o. female  presents for a complete physical. She has GERD (gastroesophageal reflux disease); IBS (irritable bowel syndrome); Migraines; Vitamin D deficiency; Screening for colorectal cancer; and Prediabetes on their problem list.   Feels as though she is going through menopause.  She has noticed increase in hot flashes and joint pain, insomnia.  She feels as though it is harder to get dressed in the mornings.  She reports most notieably over the weekend, the pain was more significant.  Unable to put on shirt without pain,  unable to take off bra.  Most notably in the right hip, has trouble laying on that area. She has noticed moodieness, wanting to cry, the pain has become more significant since 03/2021.  She did the  blood work with GYN in 05/2021 to confirm menopause.  She had pap amd mammogram 11/2021. All normal.     She had Covid 2021 but not recent.  She is not vaccinated    PAIN:  Are you having pain? No but can get to 9/10 but this eases in about 15-20 seconds   PRECAUTIONS: None  WEIGHT BEARING RESTRICTIONS: No  FALLS:  Has patient fallen in last 6 months? No  LIVING ENVIRONMENT: Lives with: lives with their spouse Lives in: House/apartment Stairs: 5 STE with L ascending rail, no steps inside  Has following equipment at home: None  OCCUPATION: Medical illustrator, at desk all day long   PLOF: Independent  PATIENT GOALS: be pain free, get down on floor and not have trouble getting back up    NEXT MD VISIT:   OBJECTIVE:   DIAGNOSTIC FINDINGS: IMPRESSION: Mild L5-S1 facet hypertrophy. Minimal anterior spurring at L2-L3 and L3-L4.  PATIENT SURVEYS:  FOTO 61.7  COGNITION: Overall cognitive status: Within functional limits for tasks assessed     SENSATION: WFL Not tested  EDEMA:    MUSCLE LENGTH:  HS and piriformis OK B, R hip flexor surprisingly flexible   POSTURE:   PALPATION: R upper quad and hip flexor TTP and tight, otherwise all mm groups OK  LOWER EXTREMITY ROM:  Passive ROM Right eval Left eval  Hip flexion WNL  WNL   Hip extension    Hip abduction WNL  WNL   Hip adduction    Hip internal rotation Severe limitation  Severe limitation   Hip external rotation Mild limitation  Mild limitation   Knee flexion    Knee extension    Ankle dorsiflexion    Ankle plantarflexion    Ankle inversion    Ankle eversion     (Blank rows = not tested)  Lumbar ROM:   flexion WNL RFIS no changes; extension mild limitation/mild stiffness and pain REIS mild pain R lumbar spine; lateral flexion L lateral flexion moderate limitation R lateral flexion WNL  LOWER EXTREMITY MMT:  MMT Right eval Left eval  Hip flexion 3 4+  Hip extension 3 4  Hip abduction 4 3+  Hip adduction    Hip internal rotation    Hip external rotation    Knee flexion 5 4+  Knee extension 5 4+  Ankle dorsiflexion 5 5  Ankle plantarflexion    Ankle inversion    Ankle eversion     (Blank rows = not tested)  LOWER EXTREMITY SPECIAL TESTS:  Hip special tests: LLD (-); FADIR (-), FABER (+)  FUNCTIONAL TESTS:    GAIT: Distance walked: in clinic distances  Assistive device utilized: None Level of assistance: Complete Independence Comments: WNL    TODAY'S TREATMENT:                                                                                                                              DATE:   Elizabeth Sauer  Objective measures + appropriate education  TherEx   Nustep L4 x6  minutes BLEs only  Bridge with red TB/hip ABD x10 Sidelying clam shells red TB x10 B Standing hip ABD red TB x10 B Hip hikes x10 B   PATIENT EDUCATION:  Education details: exam findings, POC, HEP; no need for MRI in this case from PT standpoint- tx would not change even if imaging were done based on findings today  Person educated: Patient Education method: Explanation, Demonstration, and Handouts Education comprehension: verbalized understanding and returned demonstration  HOME EXERCISE PROGRAM: Access Code: [email or web address] URL: [email or web address] Date: 03/02/2022 Prepared by: Deniece Ree  Exercises - Supine Bridge with Resistance Band  - 2 x daily - 7 x weekly - 1 sets - 10 reps - 2 hold - Clamshell with Resistance  - 2 x daily - 7 x weekly - 1 sets - 10 reps - 2 hold - Hip Abduction with Resistance Loop  - 2 x daily - 7 x weekly - 1 sets - 10 reps - Standing Hip Hiking  - 2 x daily - 7 x weekly - 1 sets - 10 reps - 1 hold  ASSESSMENT:  CLINICAL IMPRESSION: Patient is a 51 y.o. F who was seen today for physical therapy evaluation and treatment for R hip pain. Exam is consistent with possible labral tear however based on findings I think that skilled PT services can definitely help her reduce pain/improve function by address deficits in strength, hip ROM, and core weakness. I do question if there may be a component of lumbar radiculopathy as well but I was unable to aggravate this during exam today.  Will benefit from skilled PT services to address functional limitations and optimize overall level of function moving forward.   OBJECTIVE IMPAIRMENTS: decreased mobility, decreased ROM, decreased strength, increased fascial restrictions, improper body mechanics, postural dysfunction, and pain.   ACTIVITY LIMITATIONS: carrying, lifting, bending, sitting, standing, stairs, and locomotion level  PARTICIPATION LIMITATIONS: cleaning, laundry, shopping, community activity, and  occupation  PERSONAL FACTORS: Age, Behavior pattern, Fitness, Profession, and Time since onset of injury/illness/exacerbation are also affecting patient's functional outcome.   REHAB POTENTIAL: Good  CLINICAL DECISION MAKING: Stable/uncomplicated  EVALUATION COMPLEXITY: Low   GOALS: Goals reviewed with patient? Yes  SHORT TERM GOALS: Target date: 03/23/2022   Will be compliant with appropriate progressive HEP  Baseline: Goal status: INITIAL  2.  Pain to be no more than 6/10 at worst  Baseline:  Goal status: INITIAL  3.  Will be able to sit in figure 4 position for short periods without increase in pain to improve ability to put on shoes/socks  Baseline:  Goal status: INITIAL  4.  Hip rotation mobility deficits to have improved by at least 50%  Baseline:  Goal status: INITIAL    LONG TERM GOALS: Target date: 04/13/2022    MMT to have improved by at least 1 grade in all weak groups  Baseline:  Goal status: INITIAL  2.  Will be able to sit cross legged and figure 4 positions for at least 30 minutes with pain no more than 3/10  Baseline:  Goal status: INITIAL  3.  Will be able to get up from the floor with pain no more than 3/10 Baseline:  Goal status: INITIAL  4.  Will be able to sit for at least 60-90 minutes without increase in pain  Baseline:  Goal status: INITIAL     PLAN:  PT FREQUENCY: 2x/week  PT DURATION: 6 weeks  PLANNED INTERVENTIONS: Therapeutic exercises, Therapeutic activity, Neuromuscular re-education, Balance training, Gait training, Patient/Family education, Self Care, Joint mobilization, Stair training, Aquatic Therapy, Dry Needling, Electrical stimulation, Spinal mobilization, Cryotherapy, Moist heat, Taping, Ultrasound, Ionotophoresis '4mg'$ /ml Dexamethasone, Manual therapy, and Re-evaluation  PLAN FOR NEXT SESSION: hip ROM and strength, consider hip joint mobs and traction    Baylynn Shifflett U PT DPT PN2  03/02/2022, 4:02 PM

## 2022-03-05 ENCOUNTER — Ambulatory Visit (HOSPITAL_BASED_OUTPATIENT_CLINIC_OR_DEPARTMENT_OTHER): Payer: BC Managed Care – PPO | Admitting: Physical Therapy

## 2022-03-05 ENCOUNTER — Encounter (HOSPITAL_BASED_OUTPATIENT_CLINIC_OR_DEPARTMENT_OTHER): Payer: Self-pay | Admitting: Physical Therapy

## 2022-03-05 DIAGNOSIS — R29898 Other symptoms and signs involving the musculoskeletal system: Secondary | ICD-10-CM

## 2022-03-05 DIAGNOSIS — M25551 Pain in right hip: Secondary | ICD-10-CM

## 2022-03-05 DIAGNOSIS — M6281 Muscle weakness (generalized): Secondary | ICD-10-CM

## 2022-03-05 DIAGNOSIS — M5459 Other low back pain: Secondary | ICD-10-CM

## 2022-03-05 DIAGNOSIS — M545 Low back pain, unspecified: Secondary | ICD-10-CM | POA: Diagnosis not present

## 2022-03-05 DIAGNOSIS — R52 Pain, unspecified: Secondary | ICD-10-CM | POA: Diagnosis not present

## 2022-03-05 NOTE — Therapy (Signed)
OUTPATIENT PHYSICAL THERAPY LOWER EXTREMITY EVALUATION   Patient Name: Tina Holmes MRN: [id number] DOB:04/30/1970, 51 y.o., female Today's Date: 03/05/2022  END OF SESSION:  PT End of Session - 03/05/22 1502     Visit Number 2    Number of Visits 13    Date for PT Re-Evaluation 04/13/22    Authorization Type [employer]    Authorization Time Period 03/02/22 to 04/27/22    PT Start Time 1432    PT Stop Time 1502   did not require full session   PT Time Calculation (min) 30 min    Activity Tolerance Patient tolerated treatment well    Behavior During Therapy WFL for tasks assessed/performed              Past Medical History:  Diagnosis Date   Allergy    Anemia    Anxiety    Depression    GERD (gastroesophageal reflux disease)    IBS (irritable bowel syndrome)    Migraines    Restless leg syndrome    Past Surgical History:  Procedure Laterality Date   CHOLECYSTECTOMY     TERMINATION OF PREGNANC     18 WEEKS--DOWNS SYNDROME   Patient Active Problem List   Diagnosis Date Noted   Prediabetes 08/14/2015   Screening for colorectal cancer 08/14/2014   Vitamin D deficiency 08/10/2013   IBS (irritable bowel syndrome)    Migraines    GERD (gastroesophageal reflux disease)     PCP: Unk Pinto MD   REFERRING PROVIDER: Darrol Jump, NP  REFERRING DIAG: R52 (ICD-10-CM) - Generalized pain M25.551 (ICD-10-CM) - Right hip pain R52 (ICD-10-CM) - Pain aggravated by walking  THERAPY DIAG:  Pain in right hip  Muscle weakness (generalized)  Other low back pain  Other symptoms and signs involving the musculoskeletal system  Rationale for Evaluation and Treatment: Rehabilitation  ONSET DATE: 01/23/2022  SUBJECTIVE:   SUBJECTIVE STATEMENT:  Things are feeling about the same, HEP is going well doing it every day   PERTINENT HISTORY: HPI  50 y.o. female  presents for a complete physical. She has GERD (gastroesophageal reflux disease); IBS (irritable  bowel syndrome); Migraines; Vitamin D deficiency; Screening for colorectal cancer; and Prediabetes on their problem list.   Feels as though she is going through menopause.  She has noticed increase in hot flashes and joint pain, insomnia.  She feels as though it is harder to get dressed in the mornings.  She reports most notieably over the weekend, the pain was more significant.  Unable to put on shirt without pain,  unable to take off bra.  Most notably in the right hip, has trouble laying on that area. She has noticed moodieness, wanting to cry, the pain has become more significant since 03/2021.  She did the blood work with GYN in 05/2021 to confirm menopause.  She had pap amd mammogram 11/2021. All normal.     She had Covid 2021 but not recent.  She is not vaccinated    PAIN:  Are you having pain? No but still felt it when I went to put my shoes and socks on   PRECAUTIONS: None  WEIGHT BEARING RESTRICTIONS: No  FALLS:  Has patient fallen in last 6 months? No  LIVING ENVIRONMENT: Lives with: lives with their spouse Lives in: House/apartment Stairs: 5 STE with L ascending rail, no steps inside  Has following equipment at home: None  OCCUPATION: Medical illustrator, at desk all day long   PLOF: Independent  PATIENT  GOALS: be pain free, get down on floor and not have trouble getting back up   NEXT MD VISIT: December 11th   OBJECTIVE:   DIAGNOSTIC FINDINGS: IMPRESSION: Mild L5-S1 facet hypertrophy. Minimal anterior spurring at L2-L3 and L3-L4.  PATIENT SURVEYS:  FOTO 61.7  COGNITION: Overall cognitive status: Within functional limits for tasks assessed     SENSATION: WFL Not tested  EDEMA:    MUSCLE LENGTH:  HS and piriformis OK B, R hip flexor surprisingly flexible   POSTURE:   PALPATION: R upper quad and hip flexor TTP and tight, otherwise all mm groups OK  LOWER EXTREMITY ROM:  Passive ROM Right eval Left eval  Hip flexion WNL  WNL   Hip extension    Hip  abduction WNL  WNL   Hip adduction    Hip internal rotation Severe limitation  Severe limitation   Hip external rotation Mild limitation  Mild limitation   Knee flexion    Knee extension    Ankle dorsiflexion    Ankle plantarflexion    Ankle inversion    Ankle eversion     (Blank rows = not tested)  Lumbar ROM:   flexion WNL RFIS no changes; extension mild limitation/mild stiffness and pain REIS mild pain R lumbar spine; lateral flexion L lateral flexion moderate limitation R lateral flexion WNL  LOWER EXTREMITY MMT:  MMT Right eval Left eval  Hip flexion 3 4+  Hip extension 3 4  Hip abduction 4 3+  Hip adduction    Hip internal rotation    Hip external rotation    Knee flexion 5 4+  Knee extension 5 4+  Ankle dorsiflexion 5 5  Ankle plantarflexion    Ankle inversion    Ankle eversion     (Blank rows = not tested)  LOWER EXTREMITY SPECIAL TESTS:  Hip special tests: LLD (-); FADIR (-), FABER (+)  FUNCTIONAL TESTS:    GAIT: Distance walked: in clinic distances  Assistive device utilized: None Level of assistance: Complete Independence Comments: WNL    TODAY'S TREATMENT:                                                                                                                              DATE:   03/05/22  TherEx  Nustep L4 x6 minutes BLEs only Bridge + hip ABD into green TB x10  Walking bridges x12 Hip abductors green TB x12  Hip extensors green TB x12 Quadruped hip ABD x12 green TB B Quadruped hip extension x12 green TB B  STS green TB around knees x10  Hip hikes x15 B Hip hikes 3 way x15 B   Eval   Objective measures + appropriate education  TherEx   Nustep L4 x6 minutes BLEs only  Bridge with red TB/hip ABD x10 Sidelying clam shells red TB x10 B Standing hip ABD red TB x10 B Hip hikes x10 B   PATIENT EDUCATION:  Education details: exercise form  and purpose, education on-going management if imaging did find that she has a labral tear  but that PT management would likely stay the same in this case Person educated: Patient Education method: Explanation, Demonstration, and Handouts Education comprehension: verbalized understanding and returned demonstration  HOME EXERCISE PROGRAM: Access Code: [email or web address] URL: [email or web address] Date: 03/02/2022 Prepared by: Deniece Ree  Exercises - Supine Bridge with Resistance Band  - 2 x daily - 7 x weekly - 1 sets - 10 reps - 2 hold - Clamshell with Resistance  - 2 x daily - 7 x weekly - 1 sets - 10 reps - 2 hold - Hip Abduction with Resistance Loop  - 2 x daily - 7 x weekly - 1 sets - 10 reps - Standing Hip Hiking  - 2 x daily - 7 x weekly - 1 sets - 10 reps - 1 hold  ASSESSMENT:  CLINICAL IMPRESSION:  [place] arrives doing well today, no pain unless she crosses her legs to put on shoes/socks. Focused on functional strengthening and hip stability today with no increased pain noted, fatigue and mm soreness only. Education provided about possible DOMS after PT today and management strategies. Will continue to progress as able and tolerated.   OBJECTIVE IMPAIRMENTS: decreased mobility, decreased ROM, decreased strength, increased fascial restrictions, improper body mechanics, postural dysfunction, and pain.   ACTIVITY LIMITATIONS: carrying, lifting, bending, sitting, standing, stairs, and locomotion level  PARTICIPATION LIMITATIONS: cleaning, laundry, shopping, community activity, and occupation  PERSONAL FACTORS: Age, Behavior pattern, Fitness, Profession, and Time since onset of injury/illness/exacerbation are also affecting patient's functional outcome.   REHAB POTENTIAL: Good  CLINICAL DECISION MAKING: Stable/uncomplicated  EVALUATION COMPLEXITY: Low   GOALS: Goals reviewed with patient? Yes  SHORT TERM GOALS: Target date: 03/23/2022   Will be compliant with appropriate progressive HEP  Baseline: Goal status: INITIAL  2.  Pain to be no more than  6/10 at worst  Baseline:  Goal status: INITIAL  3.  Will be able to sit in figure 4 position for short periods without increase in pain to improve ability to put on shoes/socks  Baseline:  Goal status: INITIAL  4.  Hip rotation mobility deficits to have improved by at least 50%  Baseline:  Goal status: INITIAL    LONG TERM GOALS: Target date: 04/13/2022    MMT to have improved by at least 1 grade in all weak groups  Baseline:  Goal status: INITIAL  2.  Will be able to sit cross legged and figure 4 positions for at least 30 minutes with pain no more than 3/10  Baseline:  Goal status: INITIAL  3.  Will be able to get up from the floor with pain no more than 3/10 Baseline:  Goal status: INITIAL  4.  Will be able to sit for at least 60-90 minutes without increase in pain  Baseline:  Goal status: INITIAL     PLAN:  PT FREQUENCY: 2x/week  PT DURATION: 6 weeks  PLANNED INTERVENTIONS: Therapeutic exercises, Therapeutic activity, Neuromuscular re-education, Balance training, Gait training, Patient/Family education, Self Care, Joint mobilization, Stair training, Aquatic Therapy, Dry Needling, Electrical stimulation, Spinal mobilization, Cryotherapy, Moist heat, Taping, Ultrasound, Ionotophoresis '4mg'$ /ml Dexamethasone, Manual therapy, and Re-evaluation  PLAN FOR NEXT SESSION: hip ROM and strength, consider hip joint mobs and traction    Lanya Bucks U PT DPT PN2  03/05/2022, 3:06 PM

## 2022-03-09 ENCOUNTER — Encounter (HOSPITAL_BASED_OUTPATIENT_CLINIC_OR_DEPARTMENT_OTHER): Payer: Self-pay | Admitting: Physical Therapy

## 2022-03-09 ENCOUNTER — Ambulatory Visit (HOSPITAL_BASED_OUTPATIENT_CLINIC_OR_DEPARTMENT_OTHER): Payer: BC Managed Care – PPO | Admitting: Physical Therapy

## 2022-03-09 DIAGNOSIS — M6281 Muscle weakness (generalized): Secondary | ICD-10-CM

## 2022-03-09 DIAGNOSIS — M5459 Other low back pain: Secondary | ICD-10-CM

## 2022-03-09 DIAGNOSIS — M25551 Pain in right hip: Secondary | ICD-10-CM

## 2022-03-09 DIAGNOSIS — R52 Pain, unspecified: Secondary | ICD-10-CM | POA: Diagnosis not present

## 2022-03-09 DIAGNOSIS — M545 Low back pain, unspecified: Secondary | ICD-10-CM | POA: Diagnosis not present

## 2022-03-09 DIAGNOSIS — R29898 Other symptoms and signs involving the musculoskeletal system: Secondary | ICD-10-CM

## 2022-03-09 NOTE — Therapy (Unsigned)
OUTPATIENT PHYSICAL THERAPY LOWER EXTREMITY EVALUATION   Patient Name: Tina Holmes MRN: [id number] DOB:Apr 25, 1970, 51 y.o., female Today's Date: 03/05/2022  END OF SESSION:  PT End of Session - 03/05/22 1502     Visit Number 2    Number of Visits 13    Date for PT Re-Evaluation 04/13/22    Authorization Type [employer]    Authorization Time Period 03/02/22 to 04/27/22    PT Start Time 1432    PT Stop Time 1502   did not require full session   PT Time Calculation (min) 30 min    Activity Tolerance Patient tolerated treatment well    Behavior During Therapy WFL for tasks assessed/performed              Past Medical History:  Diagnosis Date   Allergy    Anemia    Anxiety    Depression    GERD (gastroesophageal reflux disease)    IBS (irritable bowel syndrome)    Migraines    Restless leg syndrome    Past Surgical History:  Procedure Laterality Date   CHOLECYSTECTOMY     TERMINATION OF PREGNANC     18 WEEKS--DOWNS SYNDROME   Patient Active Problem List   Diagnosis Date Noted   Prediabetes 08/14/2015   Screening for colorectal cancer 08/14/2014   Vitamin D deficiency 08/10/2013   IBS (irritable bowel syndrome)    Migraines    GERD (gastroesophageal reflux disease)     PCP: Unk Pinto MD   REFERRING PROVIDER: Darrol Jump, NP  REFERRING DIAG: R52 (ICD-10-CM) - Generalized pain M25.551 (ICD-10-CM) - Right hip pain R52 (ICD-10-CM) - Pain aggravated by walking  THERAPY DIAG:  Pain in right hip  Muscle weakness (generalized)  Other low back pain  Other symptoms and signs involving the musculoskeletal system  Rationale for Evaluation and Treatment: Rehabilitation  ONSET DATE: 01/23/2022  SUBJECTIVE:   SUBJECTIVE STATEMENT:  Things are feeling about the same, HEP is going well doing it every day   PERTINENT HISTORY: HPI  51 y.o. female  presents for a complete physical. She has GERD (gastroesophageal reflux disease); IBS (irritable  bowel syndrome); Migraines; Vitamin D deficiency; Screening for colorectal cancer; and Prediabetes on their problem list.   Feels as though she is going through menopause.  She has noticed increase in hot flashes and joint pain, insomnia.  She feels as though it is harder to get dressed in the mornings.  She reports most notieably over the weekend, the pain was more significant.  Unable to put on shirt without pain,  unable to take off bra.  Most notably in the right hip, has trouble laying on that area. She has noticed moodieness, wanting to cry, the pain has become more significant since 03/2021.  She did the blood work with GYN in 05/2021 to confirm menopause.  She had pap amd mammogram 11/2021. All normal.     She had Covid 2021 but not recent.  She is not vaccinated    PAIN:  Are you having pain? No but still felt it when I went to put my shoes and socks on   PRECAUTIONS: None  WEIGHT BEARING RESTRICTIONS: No  FALLS:  Has patient fallen in last 6 months? No  LIVING ENVIRONMENT: Lives with: lives with their spouse Lives in: House/apartment Stairs: 5 STE with L ascending rail, no steps inside  Has following equipment at home: None  OCCUPATION: Medical illustrator, at desk all day long   PLOF: Independent  PATIENT  GOALS: be pain free, get down on floor and not have trouble getting back up   NEXT MD VISIT: December 11th   OBJECTIVE:   DIAGNOSTIC FINDINGS: IMPRESSION: Mild L5-S1 facet hypertrophy. Minimal anterior spurring at L2-L3 and L3-L4.  PATIENT SURVEYS:  FOTO 61.7  COGNITION: Overall cognitive status: Within functional limits for tasks assessed     SENSATION: WFL Not tested  EDEMA:    MUSCLE LENGTH:  HS and piriformis OK B, R hip flexor surprisingly flexible   POSTURE:   PALPATION: R upper quad and hip flexor TTP and tight, otherwise all mm groups OK  LOWER EXTREMITY ROM:  Passive ROM Right eval Left eval  Hip flexion WNL  WNL   Hip extension    Hip  abduction WNL  WNL   Hip adduction    Hip internal rotation Severe limitation  Severe limitation   Hip external rotation Mild limitation  Mild limitation   Knee flexion    Knee extension    Ankle dorsiflexion    Ankle plantarflexion    Ankle inversion    Ankle eversion     (Blank rows = not tested)  Lumbar ROM:   flexion WNL RFIS no changes; extension mild limitation/mild stiffness and pain REIS mild pain R lumbar spine; lateral flexion L lateral flexion moderate limitation R lateral flexion WNL  LOWER EXTREMITY MMT:  MMT Right eval Left eval  Hip flexion 3 4+  Hip extension 3 4  Hip abduction 4 3+  Hip adduction    Hip internal rotation    Hip external rotation    Knee flexion 5 4+  Knee extension 5 4+  Ankle dorsiflexion 5 5  Ankle plantarflexion    Ankle inversion    Ankle eversion     (Blank rows = not tested)  LOWER EXTREMITY SPECIAL TESTS:  Hip special tests: LLD (-); FADIR (-), FABER (+)  FUNCTIONAL TESTS:    GAIT: Distance walked: in clinic distances  Assistive device utilized: None Level of assistance: Complete Independence Comments: WNL    TODAY'S TREATMENT:                                                                                                                              DATE:   12/11 Assessed motion. Had full pain free range today. Hyer mobility noted in general in her hip with ER /IR   LTR 2x10  Gluteal stretch 2x20 sec hold   Bridge 2x15 green band   Reviewed use of roller to anterior hip   Quadruped extension 2x10 green each leg  Quadruped Abduction 2x10 each leg green   Lateral band walk 2x10 green  Monster walk 2x10 green     03/05/22  TherEx  Nustep L4 x6 minutes BLEs only Bridge + hip ABD into green TB x10  Walking bridges x12 Hip abductors green TB x12  Hip extensors green TB x12 Quadruped hip ABD x12 green TB B Quadruped hip extension  x12 green TB B  STS green TB around knees x10  Hip hikes x15 B Hip hikes 3  way x15 B   Eval   Objective measures + appropriate education  TherEx   Nustep L4 x6 minutes BLEs only  Bridge with red TB/hip ABD x10 Sidelying clam shells red TB x10 B Standing hip ABD red TB x10 B Hip hikes x10 B   PATIENT EDUCATION:  Education details: exercise form and purpose, education on-going management if imaging did find that she has a labral tear but that PT management would likely stay the same in this case Person educated: Patient Education method: Explanation, Demonstration, and Handouts Education comprehension: verbalized understanding and returned demonstration  HOME EXERCISE PROGRAM: Access Code: [email or web address] URL: [email or web address] Date: 03/02/2022 Prepared by: Deniece Ree  Exercises - Supine Bridge with Resistance Band  - 2 x daily - 7 x weekly - 1 sets - 10 reps - 2 hold - Clamshell with Resistance  - 2 x daily - 7 x weekly - 1 sets - 10 reps - 2 hold - Hip Abduction with Resistance Loop  - 2 x daily - 7 x weekly - 1 sets - 10 reps - Standing Hip Hiking  - 2 x daily - 7 x weekly - 1 sets - 10 reps - 1 hold  ASSESSMENT:  CLINICAL IMPRESSION:  The patients movement is improving. After warming up she had very little pain at end range with ER or IR. She was given an LTR for home. We also reviewed self anterior trigger point release with a roller and with her hand. We also reviewed her HEP. She was given the quadruped series she did last visit for home. She was also given a band walk series to work on at home. She tolerated well. She was given an updated HEP.  OBJECTIVE IMPAIRMENTS: decreased mobility, decreased ROM, decreased strength, increased fascial restrictions, improper body mechanics, postural dysfunction, and pain.   ACTIVITY LIMITATIONS: carrying, lifting, bending, sitting, standing, stairs, and locomotion level  PARTICIPATION LIMITATIONS: cleaning, laundry, shopping, community activity, and occupation  PERSONAL FACTORS: Age,  Behavior pattern, Fitness, Profession, and Time since onset of injury/illness/exacerbation are also affecting patient's functional outcome.   REHAB POTENTIAL: Good  CLINICAL DECISION MAKING: Stable/uncomplicated  EVALUATION COMPLEXITY: Low   GOALS: Goals reviewed with patient? Yes  SHORT TERM GOALS: Target date: 03/23/2022   Will be compliant with appropriate progressive HEP  Baseline: Goal status: INITIAL  2.  Pain to be no more than 6/10 at worst  Baseline:  Goal status: INITIAL  3.  Will be able to sit in figure 4 position for short periods without increase in pain to improve ability to put on shoes/socks  Baseline:  Goal status: INITIAL  4.  Hip rotation mobility deficits to have improved by at least 50%  Baseline:  Goal status: INITIAL    LONG TERM GOALS: Target date: 04/13/2022    MMT to have improved by at least 1 grade in all weak groups  Baseline:  Goal status: INITIAL  2.  Will be able to sit cross legged and figure 4 positions for at least 30 minutes with pain no more than 3/10  Baseline:  Goal status: INITIAL  3.  Will be able to get up from the floor with pain no more than 3/10 Baseline:  Goal status: INITIAL  4.  Will be able to sit for at least 60-90 minutes without increase in pain  Baseline:  Goal status: INITIAL  PLAN:  PT FREQUENCY: 2x/week  PT DURATION: 6 weeks  PLANNED INTERVENTIONS: Therapeutic exercises, Therapeutic activity, Neuromuscular re-education, Balance training, Gait training, Patient/Family education, Self Care, Joint mobilization, Stair training, Aquatic Therapy, Dry Needling, Electrical stimulation, Spinal mobilization, Cryotherapy, Moist heat, Taping, Ultrasound, Ionotophoresis '4mg'$ /ml Dexamethasone, Manual therapy, and Re-evaluation  PLAN FOR NEXT SESSION: hip ROM and strength, consider hip joint mobs and traction    Carolyne Littles PT DPT  03/05/2022, 3:06 PM

## 2022-03-13 ENCOUNTER — Encounter (HOSPITAL_BASED_OUTPATIENT_CLINIC_OR_DEPARTMENT_OTHER): Payer: Self-pay | Admitting: Physical Therapy

## 2022-03-13 ENCOUNTER — Ambulatory Visit (HOSPITAL_BASED_OUTPATIENT_CLINIC_OR_DEPARTMENT_OTHER): Payer: BC Managed Care – PPO | Admitting: Physical Therapy

## 2022-03-13 DIAGNOSIS — M6281 Muscle weakness (generalized): Secondary | ICD-10-CM | POA: Diagnosis not present

## 2022-03-13 DIAGNOSIS — M545 Low back pain, unspecified: Secondary | ICD-10-CM | POA: Diagnosis not present

## 2022-03-13 DIAGNOSIS — M25551 Pain in right hip: Secondary | ICD-10-CM

## 2022-03-13 DIAGNOSIS — M5459 Other low back pain: Secondary | ICD-10-CM

## 2022-03-13 DIAGNOSIS — R52 Pain, unspecified: Secondary | ICD-10-CM | POA: Diagnosis not present

## 2022-03-13 DIAGNOSIS — R29898 Other symptoms and signs involving the musculoskeletal system: Secondary | ICD-10-CM

## 2022-03-13 NOTE — Therapy (Signed)
OUTPATIENT PHYSICAL THERAPY LOWER EXTREMITY EVALUATION   Patient Name: Tina Holmes MRN: [id number] DOB:[birth year], 51 y.o., female Today's Date: 03/13/2022  END OF SESSION:  PT End of Session - 03/13/22 1517     Visit Number 4    Number of Visits 13    Date for PT Re-Evaluation 04/13/22    Authorization Type BC[employer]    Authorization Time Period 03/02/22 to 04/27/22    PT Start Time 1513    PT Stop Time 1555    PT Time Calculation (min) 42 min    Activity Tolerance Patient tolerated treatment well    Behavior During Therapy WFL for tasks assessed/performed               Past Medical History:  Diagnosis Date   Allergy    Anemia    Anxiety    Depression    GERD (gastroesophageal reflux disease)    IBS (irritable bowel syndrome)    Migraines    Restless leg syndrome    Past Surgical History:  Procedure Laterality Date   CHOLECYSTECTOMY     TERMINATION OF PREGNANC     18 WEEKS--DOWNS SYNDROME   Patient Active Problem List   Diagnosis Date Noted   Prediabetes 08/14/2015   Screening for colorectal cancer 08/14/2014   Vitamin D deficiency 08/10/2013   IBS (irritable bowel syndrome)    Migraines    GERD (gastroesophageal reflux disease)     PCP: Unk Pinto MD   REFERRING PROVIDER: Darrol Jump, NP  REFERRING DIAG: R52 (ICD-10-CM) - Generalized pain M25.551 (ICD-10-CM) - Right hip pain R52 (ICD-10-CM) - Pain aggravated by walking  THERAPY DIAG:  Pain in right hip  Other low back pain  Muscle weakness (generalized)  Other symptoms and signs involving the musculoskeletal system  Rationale for Evaluation and Treatment: Rehabilitation  ONSET DATE: 01/23/2022  SUBJECTIVE:   SUBJECTIVE STATEMENT:  I'm noticing that the hips are getting a little more mobile, able to cross my legs a little bit now but still have pain after a point. No pain unless I'm getting down onto the floor or getting dressed   PERTINENT HISTORY: HPI  51 y.o. female   presents for a complete physical. She has GERD (gastroesophageal reflux disease); IBS (irritable bowel syndrome); Migraines; Vitamin D deficiency; Screening for colorectal cancer; and Prediabetes on their problem list.   Feels as though she is going through menopause.  She has noticed increase in hot flashes and joint pain, insomnia.  She feels as though it is harder to get dressed in the mornings.  She reports most notieably over the weekend, the pain was more significant.  Unable to put on shirt without pain,  unable to take off bra.  Most notably in the right hip, has trouble laying on that area. She has noticed moodieness, wanting to cry, the pain has become more significant since 03/2021.  She did the blood work with GYN in 05/2021 to confirm menopause.  She had pap amd mammogram 11/2021. All normal.     She had Covid 2021 but not recent.  She is not vaccinated    PAIN:  Are you having pain? No but can get to as much as 8/10 at highest if I really push   PRECAUTIONS: None  WEIGHT BEARING RESTRICTIONS: No  FALLS:  Has patient fallen in last 6 months? No  LIVING ENVIRONMENT: Lives with: lives with their spouse Lives in: House/apartment Stairs: 5 STE with L ascending rail, no steps inside  Has  following equipment at home: None  OCCUPATION: insurance agent, at desk all day long   PLOF: Independent  PATIENT GOALS: be pain free, get down on floor and not have trouble getting back up   NEXT MD VISIT: December 11th   OBJECTIVE:   DIAGNOSTIC FINDINGS: IMPRESSION: Mild L5-S1 facet hypertrophy. Minimal anterior spurring at L2-L3 and L3-L4.  PATIENT SURVEYS:  FOTO 61.7  COGNITION: Overall cognitive status: Within functional limits for tasks assessed     SENSATION: WFL Not tested  EDEMA:    MUSCLE LENGTH:  HS and piriformis OK B, R hip flexor surprisingly flexible   POSTURE:   PALPATION: R upper quad and hip flexor TTP and tight, otherwise all mm groups OK  LOWER  EXTREMITY ROM:  Passive ROM Right eval Left eval  Hip flexion WNL  WNL   Hip extension    Hip abduction WNL  WNL   Hip adduction    Hip internal rotation Severe limitation  Severe limitation   Hip external rotation Mild limitation  Mild limitation   Knee flexion    Knee extension    Ankle dorsiflexion    Ankle plantarflexion    Ankle inversion    Ankle eversion     (Blank rows = not tested)  Lumbar ROM:   flexion WNL RFIS no changes; extension mild limitation/mild stiffness and pain REIS mild pain R lumbar spine; lateral flexion L lateral flexion moderate limitation R lateral flexion WNL  LOWER EXTREMITY MMT:  MMT Right eval Left eval  Hip flexion 3 4+  Hip extension 3 4  Hip abduction 4 3+  Hip adduction    Hip internal rotation    Hip external rotation    Knee flexion 5 4+  Knee extension 5 4+  Ankle dorsiflexion 5 5  Ankle plantarflexion    Ankle inversion    Ankle eversion     (Blank rows = not tested)  LOWER EXTREMITY SPECIAL TESTS:  Hip special tests: LLD (-); FADIR (-), FABER (+)  FUNCTIONAL TESTS:    GAIT: Distance walked: in clinic distances  Assistive device utilized: None Level of assistance: Complete Independence Comments: WNL    TODAY'S TREATMENT:                                                                                                                              DATE:   03/13/22  TherEx  Nustep L5 x6 minutes BLEs only  Hip hikes x12 with green TB  Modified single leg sit to stands x10 B from low table, then second set with 5# KB on stance side   Power tower: hip ABD and extension 10# x10 B Single leg press in gym 50# 1x10 B Hip swings AP and lateral 3# weight for light traction x10 B  3 way taps on blue foam pad x10 B  Hip flexion + ABD x10 B Forward lunges onto BOSU x10 B Lateral lunges onto BOSU x10 B Tried  DLs with 5# KB but had increased back pain Standing marches with 5# KB and TA set x10 B  12/11 Assessed motion. Had  full pain free range today. Hyer mobility noted in general in her hip with ER /IR   LTR 2x10  Gluteal stretch 2x20 sec hold   Bridge 2x15 green band   Reviewed use of roller to anterior hip   Quadruped extension 2x10 green each leg  Quadruped Abduction 2x10 each leg green   Lateral band walk 2x10 green  Monster walk 2x10 green     03/05/22  TherEx  Nustep L4 x6 minutes BLEs only Bridge + hip ABD into green TB x10  Walking bridges x12 Hip abductors green TB x12  Hip extensors green TB x12 Quadruped hip ABD x12 green TB B Quadruped hip extension x12 green TB B  STS green TB around knees x10  Hip hikes x15 B Hip hikes 3 way x15 B   Eval   Objective measures + appropriate education  TherEx   Nustep L4 x6 minutes BLEs only  Bridge with red TB/hip ABD x10 Sidelying clam shells red TB x10 B Standing hip ABD red TB x10 B Hip hikes x10 B   PATIENT EDUCATION:  Education details: exercise form and purpose, education on-going management if imaging did find that she has a labral tear but that PT management would likely stay the same in this case Person educated: Patient Education method: Explanation, Demonstration, and Handouts Education comprehension: verbalized understanding and returned demonstration  HOME EXERCISE PROGRAM: Access Code: [email or web address] URL: [email or web address] Date: 03/02/2022 Prepared by: Deniece Ree  Exercises - Supine Bridge with Resistance Band  - 2 x daily - 7 x weekly - 1 sets - 10 reps - 2 hold - Clamshell with Resistance  - 2 x daily - 7 x weekly - 1 sets - 10 reps - 2 hold - Hip Abduction with Resistance Loop  - 2 x daily - 7 x weekly - 1 sets - 10 reps - Standing Hip Hiking  - 2 x daily - 7 x weekly - 1 sets - 10 reps - 1 hold  ASSESSMENT:  CLINICAL IMPRESSION:  Keiosha arrives today doing well, sounds like hip is gradually getting more mobile but still having a hard time with tasks such as floor to stand and dressing.  Warmed up on Nustep then continued progression of strength and stability training this afternoon. Will continue to progress as able.   OBJECTIVE IMPAIRMENTS: decreased mobility, decreased ROM, decreased strength, increased fascial restrictions, improper body mechanics, postural dysfunction, and pain.   ACTIVITY LIMITATIONS: carrying, lifting, bending, sitting, standing, stairs, and locomotion level  PARTICIPATION LIMITATIONS: cleaning, laundry, shopping, community activity, and occupation  PERSONAL FACTORS: Age, Behavior pattern, Fitness, Profession, and Time since onset of injury/illness/exacerbation are also affecting patient's functional outcome.   REHAB POTENTIAL: Good  CLINICAL DECISION MAKING: Stable/uncomplicated  EVALUATION COMPLEXITY: Low   GOALS: Goals reviewed with patient? Yes  SHORT TERM GOALS: Target date: 03/23/2022   Will be compliant with appropriate progressive HEP  Baseline: Goal status: INITIAL  2.  Pain to be no more than 6/10 at worst  Baseline:  Goal status: INITIAL  3.  Will be able to sit in figure 4 position for short periods without increase in pain to improve ability to put on shoes/socks  Baseline:  Goal status: INITIAL  4.  Hip rotation mobility deficits to have improved by at least 50%  Baseline:  Goal status: INITIAL    LONG  TERM GOALS: Target date: 04/13/2022    MMT to have improved by at least 1 grade in all weak groups  Baseline:  Goal status: INITIAL  2.  Will be able to sit cross legged and figure 4 positions for at least 30 minutes with pain no more than 3/10  Baseline:  Goal status: INITIAL  3.  Will be able to get up from the floor with pain no more than 3/10 Baseline:  Goal status: INITIAL  4.  Will be able to sit for at least 60-90 minutes without increase in pain  Baseline:  Goal status: INITIAL     PLAN:  PT FREQUENCY: 2x/week  PT DURATION: 6 weeks  PLANNED INTERVENTIONS: Therapeutic exercises, Therapeutic  activity, Neuromuscular re-education, Balance training, Gait training, Patient/Family education, Self Care, Joint mobilization, Stair training, Aquatic Therapy, Dry Needling, Electrical stimulation, Spinal mobilization, Cryotherapy, Moist heat, Taping, Ultrasound, Ionotophoresis '4mg'$ /ml Dexamethasone, Manual therapy, and Re-evaluation  PLAN FOR NEXT SESSION: hip ROM and strength, consider hip joint mobs and traction    Lamiya Naas U PT DPT PN2  03/13/2022, 3:56 PM

## 2022-03-17 ENCOUNTER — Ambulatory Visit (HOSPITAL_BASED_OUTPATIENT_CLINIC_OR_DEPARTMENT_OTHER): Payer: BC Managed Care – PPO | Admitting: Physical Therapy

## 2022-03-17 ENCOUNTER — Encounter (HOSPITAL_BASED_OUTPATIENT_CLINIC_OR_DEPARTMENT_OTHER): Payer: Self-pay | Admitting: Physical Therapy

## 2022-03-17 DIAGNOSIS — M545 Low back pain, unspecified: Secondary | ICD-10-CM | POA: Diagnosis not present

## 2022-03-17 DIAGNOSIS — R52 Pain, unspecified: Secondary | ICD-10-CM | POA: Diagnosis not present

## 2022-03-17 DIAGNOSIS — R29898 Other symptoms and signs involving the musculoskeletal system: Secondary | ICD-10-CM

## 2022-03-17 DIAGNOSIS — M6281 Muscle weakness (generalized): Secondary | ICD-10-CM

## 2022-03-17 DIAGNOSIS — M5459 Other low back pain: Secondary | ICD-10-CM

## 2022-03-17 DIAGNOSIS — M25551 Pain in right hip: Secondary | ICD-10-CM

## 2022-03-17 NOTE — Therapy (Signed)
OUTPATIENT PHYSICAL THERAPY LOWER EXTREMITY TREATMENT   Patient Name: Tina Holmes MRN: [id number] DOB:Feb 07, 1971, 51 y.o., female Today's Date: 03/17/2022  END OF SESSION:  PT End of Session - 03/17/22 1351     Visit Number 5    Number of Visits 13    Date for PT Re-Evaluation 04/13/22    Authorization Type [employer]    Authorization Time Period 03/02/22 to 04/27/22    PT Start Time 1346    PT Stop Time 1426    PT Time Calculation (min) 40 min    Activity Tolerance Patient tolerated treatment well    Behavior During Therapy WFL for tasks assessed/performed                Past Medical History:  Diagnosis Date   Allergy    Anemia    Anxiety    Depression    GERD (gastroesophageal reflux disease)    IBS (irritable bowel syndrome)    Migraines    Restless leg syndrome    Past Surgical History:  Procedure Laterality Date   CHOLECYSTECTOMY     TERMINATION OF PREGNANC     18 WEEKS--DOWNS SYNDROME   Patient Active Problem List   Diagnosis Date Noted   Prediabetes 08/14/2015   Screening for colorectal cancer 08/14/2014   Vitamin D deficiency 08/10/2013   IBS (irritable bowel syndrome)    Migraines    GERD (gastroesophageal reflux disease)     PCP: Unk Pinto MD   REFERRING PROVIDER: Darrol Jump, NP  REFERRING DIAG: R52 (ICD-10-CM) - Generalized pain M25.551 (ICD-10-CM) - Right hip pain R52 (ICD-10-CM) - Pain aggravated by walking  THERAPY DIAG:  Pain in right hip  Other low back pain  Muscle weakness (generalized)  Other symptoms and signs involving the musculoskeletal system  Rationale for Evaluation and Treatment: Rehabilitation  ONSET DATE: 01/23/2022  SUBJECTIVE:   SUBJECTIVE STATEMENT:  Felt good after last time, I can tell I've done something every time I come like a workout feeling. Hip is getting better, I'm impressed I don't have as much pain when I put my shoes on now.   PERTINENT HISTORY: HPI  51 y.o. female   presents for a complete physical. She has GERD (gastroesophageal reflux disease); IBS (irritable bowel syndrome); Migraines; Vitamin D deficiency; Screening for colorectal cancer; and Prediabetes on their problem list.   Feels as though she is going through menopause.  She has noticed increase in hot flashes and joint pain, insomnia.  She feels as though it is harder to get dressed in the mornings.  She reports most notieably over the weekend, the pain was more significant.  Unable to put on shirt without pain,  unable to take off bra.  Most notably in the right hip, has trouble laying on that area. She has noticed moodieness, wanting to cry, the pain has become more significant since 03/2021.  She did the blood work with GYN in 05/2021 to confirm menopause.  She had pap amd mammogram 11/2021. All normal.     She had Covid 2021 but not recent.  She is not vaccinated    PAIN:  Are you having pain? No but can get to as much as 6/10 at highest if I really push   PRECAUTIONS: None  WEIGHT BEARING RESTRICTIONS: No  FALLS:  Has patient fallen in last 6 months? No  LIVING ENVIRONMENT: Lives with: lives with their spouse Lives in: House/apartment Stairs: 5 STE with L ascending rail, no steps inside  Has  following equipment at home: None  OCCUPATION: insurance agent, at desk all day long   PLOF: Independent  PATIENT GOALS: be pain free, get down on floor and not have trouble getting back up   NEXT MD VISIT: PRN    OBJECTIVE:   DIAGNOSTIC FINDINGS: IMPRESSION: Mild L5-S1 facet hypertrophy. Minimal anterior spurring at L2-L3 and L3-L4.  PATIENT SURVEYS:  FOTO 61.7  COGNITION: Overall cognitive status: Within functional limits for tasks assessed     SENSATION: WFL Not tested  EDEMA:    MUSCLE LENGTH:  HS and piriformis OK B, R hip flexor surprisingly flexible   POSTURE:   PALPATION: R upper quad and hip flexor TTP and tight, otherwise all mm groups OK  LOWER EXTREMITY  ROM:  Passive ROM Right eval Left eval  Hip flexion WNL  WNL   Hip extension    Hip abduction WNL  WNL   Hip adduction    Hip internal rotation Severe limitation  Severe limitation   Hip external rotation Mild limitation  Mild limitation   Knee flexion    Knee extension    Ankle dorsiflexion    Ankle plantarflexion    Ankle inversion    Ankle eversion     (Blank rows = not tested)  Lumbar ROM:   flexion WNL RFIS no changes; extension mild limitation/mild stiffness and pain REIS mild pain R lumbar spine; lateral flexion L lateral flexion moderate limitation R lateral flexion WNL  LOWER EXTREMITY MMT:  MMT Right eval Left eval  Hip flexion 3 4+  Hip extension 3 4  Hip abduction 4 3+  Hip adduction    Hip internal rotation    Hip external rotation    Knee flexion 5 4+  Knee extension 5 4+  Ankle dorsiflexion 5 5  Ankle plantarflexion    Ankle inversion    Ankle eversion     (Blank rows = not tested)  LOWER EXTREMITY SPECIAL TESTS:  Hip special tests: LLD (-); FADIR (-), FABER (+)  FUNCTIONAL TESTS:    GAIT: Distance walked: in clinic distances  Assistive device utilized: None Level of assistance: Complete Independence Comments: WNL    TODAY'S TREATMENT:                                                                                                                              DATE:   03/17/22  TherEx  Nustep L5 x6 minutes BLEs only  Power tower: hip ABD and extensions x10 each BLEs with 10#, then x5 each with 12.5# Single leg press in gym: 55# x10 B then  65# x7 B, then 70# x5 B Hip IR box walks x10 L, tried on the right had sharp groin pain so stopped  Hip hinges x10 B to KB on 8 inch box Forward lunges onto BOSU x15 B increased lunge depth today as tolerated  Lateral lunges onto BOSU x15 B increased lunge depth today as tolerated  03/13/22  TherEx  Nustep L5 x6 minutes BLEs only  Hip hikes x12 with green TB  Modified single leg sit to  stands x10 B from low table, then second set with 5# KB on stance side   Power tower: hip ABD and extension 10# x10 B Single leg press in gym 50# 1x10 B Hip swings AP and lateral 3# weight for light traction x10 B  3 way taps on blue foam pad x10 B  Hip flexion + ABD x10 B Forward lunges onto BOSU x10 B Lateral lunges onto BOSU x10 B Tried DLs with 5# KB but had increased back pain Standing marches with 5# KB and TA set x10 B  12/11 Assessed motion. Had full pain free range today. Hyer mobility noted in general in her hip with ER /IR   LTR 2x10  Gluteal stretch 2x20 sec hold   Bridge 2x15 green band   Reviewed use of roller to anterior hip   Quadruped extension 2x10 green each leg  Quadruped Abduction 2x10 each leg green   Lateral band walk 2x10 green  Monster walk 2x10 green     03/05/22  TherEx  Nustep L4 x6 minutes BLEs only Bridge + hip ABD into green TB x10  Walking bridges x12 Hip abductors green TB x12  Hip extensors green TB x12 Quadruped hip ABD x12 green TB B Quadruped hip extension x12 green TB B  STS green TB around knees x10  Hip hikes x15 B Hip hikes 3 way x15 B   Eval   Objective measures + appropriate education  TherEx   Nustep L4 x6 minutes BLEs only  Bridge with red TB/hip ABD x10 Sidelying clam shells red TB x10 B Standing hip ABD red TB x10 B Hip hikes x10 B   PATIENT EDUCATION:  Education details: exercise form and purpose, possible DOMS and management  Person educated: Patient Education method: Explanation, Demonstration, and Handouts Education comprehension: verbalized understanding and returned demonstration  HOME EXERCISE PROGRAM: Access Code: [email or web address] URL: [email or web address] Date: 03/02/2022 Prepared by: Deniece Ree  Exercises - Supine Bridge with Resistance Band  - 2 x daily - 7 x weekly - 1 sets - 10 reps - 2 hold - Clamshell with Resistance  - 2 x daily - 7 x weekly - 1 sets - 10 reps - 2 hold -  Hip Abduction with Resistance Loop  - 2 x daily - 7 x weekly - 1 sets - 10 reps - Standing Hip Hiking  - 2 x daily - 7 x weekly - 1 sets - 10 reps - 1 hold  ASSESSMENT:  CLINICAL IMPRESSION:  Latriece arrives today doing well, sounds like PT is certainly helping her. Continued strengthening and updated some goals today as well. Will continue to progress as able.   OBJECTIVE IMPAIRMENTS: decreased mobility, decreased ROM, decreased strength, increased fascial restrictions, improper body mechanics, postural dysfunction, and pain.   ACTIVITY LIMITATIONS: carrying, lifting, bending, sitting, standing, stairs, and locomotion level  PARTICIPATION LIMITATIONS: cleaning, laundry, shopping, community activity, and occupation  PERSONAL FACTORS: Age, Behavior pattern, Fitness, Profession, and Time since onset of injury/illness/exacerbation are also affecting patient's functional outcome.   REHAB POTENTIAL: Good  CLINICAL DECISION MAKING: Stable/uncomplicated  EVALUATION COMPLEXITY: Low   GOALS: Goals reviewed with patient? Yes  SHORT TERM GOALS: Target date: 03/23/2022   Will be compliant with appropriate progressive HEP  Baseline: Goal status: MET  2.  Pain to be no more than 6/10 at worst  Baseline:  Goal  status: MET 12/19- 6/10 at worst   3.  Will be able to sit in figure 4 position for short periods without increase in pain to improve ability to put on shoes/socks  Baseline:  Goal status: IN PROGRESS 12/19- improving, still having low level pain putting on shoes and socks but better   4.  Hip rotation mobility deficits to have improved by at least 50%  Baseline:  Goal status: IN PROGRESS 12/19- IR seems to be a bit limited still     LONG TERM GOALS: Target date: 04/13/2022    MMT to have improved by at least 1 grade in all weak groups  Baseline:  Goal status: INITIAL  2.  Will be able to sit cross legged and figure 4 positions for at least 30 minutes with pain no more than  3/10  Baseline:  Goal status: INITIAL  3.  Will be able to get up from the floor with pain no more than 3/10 Baseline:  Goal status: INITIAL  4.  Will be able to sit for at least 60-90 minutes without increase in pain  Baseline:  Goal status: INITIAL     PLAN:  PT FREQUENCY: 2x/week  PT DURATION: 6 weeks  PLANNED INTERVENTIONS: Therapeutic exercises, Therapeutic activity, Neuromuscular re-education, Balance training, Gait training, Patient/Family education, Self Care, Joint mobilization, Stair training, Aquatic Therapy, Dry Needling, Electrical stimulation, Spinal mobilization, Cryotherapy, Moist heat, Taping, Ultrasound, Ionotophoresis 69m/ml Dexamethasone, Manual therapy, and Re-evaluation  PLAN FOR NEXT SESSION: hip ROM and strength, consider hip joint mobs and traction    Amayra Kiedrowski U PT DPT PN2  03/17/2022, 2:26 PM

## 2022-03-20 ENCOUNTER — Ambulatory Visit (HOSPITAL_BASED_OUTPATIENT_CLINIC_OR_DEPARTMENT_OTHER): Payer: BC Managed Care – PPO | Admitting: Physical Therapy

## 2022-03-24 ENCOUNTER — Ambulatory Visit (HOSPITAL_BASED_OUTPATIENT_CLINIC_OR_DEPARTMENT_OTHER): Payer: BC Managed Care – PPO | Admitting: Physical Therapy

## 2022-03-26 ENCOUNTER — Encounter (HOSPITAL_BASED_OUTPATIENT_CLINIC_OR_DEPARTMENT_OTHER): Payer: BC Managed Care – PPO | Admitting: Physical Therapy

## 2022-03-31 ENCOUNTER — Ambulatory Visit (HOSPITAL_BASED_OUTPATIENT_CLINIC_OR_DEPARTMENT_OTHER): Payer: BC Managed Care – PPO | Attending: Nurse Practitioner | Admitting: Physical Therapy

## 2022-03-31 DIAGNOSIS — M25551 Pain in right hip: Secondary | ICD-10-CM | POA: Insufficient documentation

## 2022-03-31 DIAGNOSIS — R29898 Other symptoms and signs involving the musculoskeletal system: Secondary | ICD-10-CM | POA: Diagnosis not present

## 2022-03-31 DIAGNOSIS — M6281 Muscle weakness (generalized): Secondary | ICD-10-CM | POA: Diagnosis not present

## 2022-03-31 DIAGNOSIS — M5459 Other low back pain: Secondary | ICD-10-CM | POA: Insufficient documentation

## 2022-03-31 NOTE — Therapy (Unsigned)
OUTPATIENT PHYSICAL THERAPY LOWER EXTREMITY TREATMENT   Patient Name: Tina Holmes MRN: [id number] DOB:March 07, 1971, 52 y.o., female Today's Date: 04/01/2022  END OF SESSION:  PT End of Session - 04/01/22 1059     Visit Number 6    Number of Visits 13    Date for PT Re-Evaluation 04/13/22    Authorization Type BCBS    PT Start Time 1600    PT Stop Time 1642    PT Time Calculation (min) 42 min    Activity Tolerance Patient tolerated treatment well    Behavior During Therapy WFL for tasks assessed/performed                 Past Medical History:  Diagnosis Date   Allergy    Anemia    Anxiety    Depression    GERD (gastroesophageal reflux disease)    IBS (irritable bowel syndrome)    Migraines    Restless leg syndrome    Past Surgical History:  Procedure Laterality Date   CHOLECYSTECTOMY     TERMINATION OF [place]     18 WEEKS--DOWNS SYNDROME   Patient Active Problem List   Diagnosis Date Noted   Prediabetes 08/14/2015   Screening for colorectal cancer 08/14/2014   Vitamin D deficiency 08/10/2013   IBS (irritable bowel syndrome)    Migraines    GERD (gastroesophageal reflux disease)     PCP: Unk Pinto MD   REFERRING PROVIDER: Darrol Jump, NP  REFERRING DIAG: R52 (ICD-10-CM) - Generalized pain M25.551 (ICD-10-CM) - Right hip pain R52 (ICD-10-CM) - Pain aggravated by walking  THERAPY DIAG:  Pain in right hip  Other low back pain  Muscle weakness (generalized)  Other symptoms and signs involving the musculoskeletal system  Rationale for Evaluation and Treatment: Rehabilitation  ONSET DATE: 01/23/2022  SUBJECTIVE:   SUBJECTIVE STATEMENT: The patient has been sick. She has been able to do much in the way of exercises.   PERTINENT HISTORY: HPI  52 y.o. female  presents for a complete physical. She has GERD (gastroesophageal reflux disease); IBS (irritable bowel syndrome); Migraines; Vitamin D deficiency; Screening for colorectal  cancer; and Prediabetes on their problem list.   Feels as though she is going through menopause.  She has noticed increase in hot flashes and joint pain, insomnia.  She feels as though it is harder to get dressed in the mornings.  She reports most notieably over the weekend, the pain was more significant.  Unable to put on shirt without pain,  unable to take off bra.  Most notably in the right hip, has trouble laying on that area. She has noticed moodieness, wanting to cry, the pain has become more significant since 03/2021.  She did the blood work with GYN in 05/2021 to confirm menopause.  She had pap amd mammogram 11/2021. All normal.     She had Covid 2021 but not recent.  She is not vaccinated    PAIN:  Are you having pain? No but can get to as much as 6/10 at highest if I really push   PRECAUTIONS: None  WEIGHT BEARING RESTRICTIONS: No  FALLS:  Has patient fallen in last 6 months? No  LIVING ENVIRONMENT: Lives with: lives with their spouse Lives in: House/apartment Stairs: 5 STE with L ascending rail, no steps inside  Has following equipment at home: None  OCCUPATION: insurance agent, at desk all day long   PLOF: Independent  PATIENT GOALS: be pain free, get down on floor and not  have trouble getting back up   NEXT MD VISIT: PRN    OBJECTIVE:   DIAGNOSTIC FINDINGS: IMPRESSION: Mild L5-S1 facet hypertrophy. Minimal anterior spurring at L2-L3 and L3-L4.  PATIENT SURVEYS:  FOTO 61.7  COGNITION: Overall cognitive status: Within functional limits for tasks assessed     SENSATION: WFL Not tested  EDEMA:    MUSCLE LENGTH:  HS and piriformis OK B, R hip flexor surprisingly flexible   POSTURE:   PALPATION: R upper quad and hip flexor TTP and tight, otherwise all mm groups OK  LOWER EXTREMITY ROM:  Passive ROM Right eval Left eval  Hip flexion WNL  WNL   Hip extension    Hip abduction WNL  WNL   Hip adduction    Hip internal rotation Severe limitation   Severe limitation   Hip external rotation Mild limitation  Mild limitation   Knee flexion    Knee extension    Ankle dorsiflexion    Ankle plantarflexion    Ankle inversion    Ankle eversion     (Blank rows = not tested)  Lumbar ROM:   flexion WNL RFIS no changes; extension mild limitation/mild stiffness and pain REIS mild pain R lumbar spine; lateral flexion L lateral flexion moderate limitation R lateral flexion WNL  LOWER EXTREMITY MMT:  MMT Right eval Left eval  Hip flexion 3 4+  Hip extension 3 4  Hip abduction 4 3+  Hip adduction    Hip internal rotation    Hip external rotation    Knee flexion 5 4+  Knee extension 5 4+  Ankle dorsiflexion 5 5  Ankle plantarflexion    Ankle inversion    Ankle eversion     (Blank rows = not tested)  LOWER EXTREMITY SPECIAL TESTS:  Hip special tests: LLD (-); FADIR (-), FABER (+)  FUNCTIONAL TESTS:    GAIT: Distance walked: in clinic distances  Assistive device utilized: None Level of assistance: Complete Independence Comments: WNL    TODAY'S TREATMENT:                                                                                                                              DATE:   1/2 Manual: Posterior and inferior glides with test re-test for hip external rotation movement. Better moement felt with mobilization. Talked to the patient about having her husband do it at home.   Leg press: lifestyles 55 lbs 3x15  Hip abduction machine 3x10 55 lbs  Forward lunges onto BOSU x15 B increased lunge depth today as tolerated  Lateral lunges onto BOSU x15 B increased lunge depth today as tolerated    03/17/22  TherEx  Nustep L5 x6 minutes BLEs only  Power tower: hip ABD and extensions x10 each BLEs with 10#, then x5 each with 12.5# Single leg press in gym: 55# x10 B then  65# x7 B, then 70# x5 B Hip IR box walks x10 L, tried on the right  had sharp groin pain so stopped  Hip hinges x10 B to KB on 8 inch box Forward lunges  onto BOSU x15 B increased lunge depth today as tolerated  Lateral lunges onto BOSU x15 B increased lunge depth today as tolerated       03/13/22  TherEx  Nustep L5 x6 minutes BLEs only  Hip hikes x12 with green TB  Modified single leg sit to stands x10 B from low table, then second set with 5# KB on stance side   Power tower: hip ABD and extension 10# x10 B Single leg press in gym 50# 1x10 B Hip swings AP and lateral 3# weight for light traction x10 B  3 way taps on blue foam pad x10 B  Hip flexion + ABD x10 B Forward lunges onto BOSU x10 B Lateral lunges onto BOSU x10 B Tried DLs with 5# KB but had increased back pain Standing marches with 5# KB and TA set x10 B    PATIENT EDUCATION:  Education details: exercise form and purpose, possible DOMS and management  Person educated: Patient Education method: Explanation, Demonstration, and Handouts Education comprehension: verbalized understanding and returned demonstration  HOME EXERCISE PROGRAM: Access Code: [email or web address] URL: [email or web address] Date: 03/02/2022 Prepared by: Deniece Ree  Exercises - Supine Bridge with Resistance Band  - 2 x daily - 7 x weekly - 1 sets - 10 reps - 2 hold - Clamshell with Resistance  - 2 x daily - 7 x weekly - 1 sets - 10 reps - 2 hold - Hip Abduction with Resistance Loop  - 2 x daily - 7 x weekly - 1 sets - 10 reps - Standing Hip Hiking  - 2 x daily - 7 x weekly - 1 sets - 10 reps - 1 hold  ASSESSMENT:  CLINICAL IMPRESSION:  The patient is doing well. She is just having pain with end range ER. We performed a trial of manual therapy. After manual her end range was improved. We will continue to work on end range motion. We continue with manual therapy if she feels a benefit. It has been a month sice she has been in for PT. We re-introduced some of her exercises. Therapy will progress as tolerated.  OBJECTIVE IMPAIRMENTS: decreased mobility, decreased ROM, decreased strength,  increased fascial restrictions, improper body mechanics, postural dysfunction, and pain.   ACTIVITY LIMITATIONS: carrying, lifting, bending, sitting, standing, stairs, and locomotion level  PARTICIPATION LIMITATIONS: cleaning, laundry, shopping, community activity, and occupation  PERSONAL FACTORS: Age, Behavior pattern, Fitness, Profession, and Time since onset of injury/illness/exacerbation are also affecting patient's functional outcome.   REHAB POTENTIAL: Good  CLINICAL DECISION MAKING: Stable/uncomplicated  EVALUATION COMPLEXITY: Low   GOALS: Goals reviewed with patient? Yes  SHORT TERM GOALS: Target date: 03/23/2022   Will be compliant with appropriate progressive HEP  Baseline: Goal status: MET  2.  Pain to be no more than 6/10 at worst  Baseline:  Goal status: MET 12/19- 6/10 at worst   3.  Will be able to sit in figure 4 position for short periods without increase in pain to improve ability to put on shoes/socks  Baseline:  Goal status: IN PROGRESS 12/19- improving, still having low level pain putting on shoes and socks but better   4.  Hip rotation mobility deficits to have improved by at least 50%  Baseline:  Goal status: IN PROGRESS 12/19- IR seems to be a bit limited still     LONG TERM GOALS: Target date: 04/13/2022  MMT to have improved by at least 1 grade in all weak groups  Baseline:  Goal status: INITIAL  2.  Will be able to sit cross legged and figure 4 positions for at least 30 minutes with pain no more than 3/10  Baseline:  Goal status: INITIAL  3.  Will be able to get up from the floor with pain no more than 3/10 Baseline:  Goal status: INITIAL  4.  Will be able to sit for at least 60-90 minutes without increase in pain  Baseline:  Goal status: INITIAL     PLAN:  PT FREQUENCY: 2x/week  PT DURATION: 6 weeks  PLANNED INTERVENTIONS: Therapeutic exercises, Therapeutic activity, Neuromuscular re-education, Balance training, Gait  training, Patient/Family education, Self Care, Joint mobilization, Stair training, Aquatic Therapy, Dry Needling, Electrical stimulation, Spinal mobilization, Cryotherapy, Moist heat, Taping, Ultrasound, Ionotophoresis 67m/ml Dexamethasone, Manual therapy, and Re-evaluation  PLAN FOR NEXT SESSION: hip ROM and strength, consider hip joint mobs and traction    Kristen U PT DPT PN2  04/01/2022, 11:06 AM

## 2022-04-01 ENCOUNTER — Encounter (HOSPITAL_BASED_OUTPATIENT_CLINIC_OR_DEPARTMENT_OTHER): Payer: Self-pay | Admitting: Physical Therapy

## 2022-04-02 ENCOUNTER — Ambulatory Visit (HOSPITAL_BASED_OUTPATIENT_CLINIC_OR_DEPARTMENT_OTHER): Payer: BC Managed Care – PPO | Admitting: Physical Therapy

## 2022-04-02 ENCOUNTER — Encounter (HOSPITAL_BASED_OUTPATIENT_CLINIC_OR_DEPARTMENT_OTHER): Payer: Self-pay | Admitting: Physical Therapy

## 2022-04-02 DIAGNOSIS — M6281 Muscle weakness (generalized): Secondary | ICD-10-CM | POA: Diagnosis not present

## 2022-04-02 DIAGNOSIS — M25551 Pain in right hip: Secondary | ICD-10-CM

## 2022-04-02 DIAGNOSIS — R29898 Other symptoms and signs involving the musculoskeletal system: Secondary | ICD-10-CM | POA: Diagnosis not present

## 2022-04-02 DIAGNOSIS — M5459 Other low back pain: Secondary | ICD-10-CM | POA: Diagnosis not present

## 2022-04-02 NOTE — Therapy (Addendum)
OUTPATIENT PHYSICAL THERAPY LOWER EXTREMITY TREATMENT/discharge    Patient Name: Tina Holmes MRN: [id number] DOB:1970/09/04, 52 y.o., female Today's Date: 04/01/2022  END OF SESSION:  PT End of Session - 04/01/22 1059     Visit Number 6    Number of Visits 13    Date for PT Re-Evaluation 04/13/22    Authorization Type BCBS    PT Start Time 1600    PT Stop Time 1642    PT Time Calculation (min) 42 min    Activity Tolerance Patient tolerated treatment well    Behavior During Therapy WFL for tasks assessed/performed                 Past Medical History:  Diagnosis Date   Allergy    Anemia    Anxiety    Depression    GERD (gastroesophageal reflux disease)    IBS (irritable bowel syndrome)    Migraines    Restless leg syndrome    Past Surgical History:  Procedure Laterality Date   CHOLECYSTECTOMY     TERMINATION OF [place]     18 WEEKS--DOWNS SYNDROME   Patient Active Problem List   Diagnosis Date Noted   Prediabetes 08/14/2015   Screening for colorectal cancer 08/14/2014   Vitamin D deficiency 08/10/2013   IBS (irritable bowel syndrome)    Migraines    GERD (gastroesophageal reflux disease)     PCP: Unk Pinto MD   REFERRING PROVIDER: Darrol Jump, NP  REFERRING DIAG: R52 (ICD-10-CM) - Generalized pain M25.551 (ICD-10-CM) - Right hip pain R52 (ICD-10-CM) - Pain aggravated by walking  THERAPY DIAG:  Pain in right hip  Other low back pain  Muscle weakness (generalized)  Other symptoms and signs involving the musculoskeletal system  Rationale for Evaluation and Treatment: Rehabilitation  ONSET DATE: 01/23/2022  SUBJECTIVE:   SUBJECTIVE STATEMENT: The patient reports she did pretty well after the last visit. She feels like it is about the same when she sits cross legged.  PERTINENT HISTORY: HPI  52 y.o. female  presents for a complete physical. She has GERD (gastroesophageal reflux disease); IBS (irritable bowel syndrome);  Migraines; Vitamin D deficiency; Screening for colorectal cancer; and Prediabetes on their problem list.   Feels as though she is going through menopause.  She has noticed increase in hot flashes and joint pain, insomnia.  She feels as though it is harder to get dressed in the mornings.  She reports most notieably over the weekend, the pain was more significant.  Unable to put on shirt without pain,  unable to take off bra.  Most notably in the right hip, has trouble laying on that area.      PAIN:  Are you having pain? No but can get to as much as 6/10 at highest if I really push   PRECAUTIONS: None  WEIGHT BEARING RESTRICTIONS: No  FALLS:  Has patient fallen in last 6 months? No  LIVING ENVIRONMENT: Lives with: lives with their spouse Lives in: House/apartment Stairs: 5 STE with L ascending rail, no steps inside  Has following equipment at home: None  OCCUPATION: Medical illustrator, at desk all day long   PLOF: Independent  PATIENT GOALS: be pain free, get down on floor and not have trouble getting back up   NEXT MD VISIT: PRN    OBJECTIVE:   DIAGNOSTIC FINDINGS: IMPRESSION: Mild L5-S1 facet hypertrophy. Minimal anterior spurring at L2-L3 and L3-L4.  PATIENT SURVEYS:  FOTO 61.7  COGNITION: Overall cognitive status: Within functional  limits for tasks assessed     SENSATION: [facility] Not tested  EDEMA:    MUSCLE LENGTH:  HS and piriformis OK B, R hip flexor surprisingly flexible   POSTURE:   PALPATION: R upper quad and hip flexor TTP and tight, otherwise all mm groups OK  LOWER EXTREMITY ROM:  Passive ROM Right eval Left eval  Hip flexion WNL  WNL   Hip extension    Hip abduction WNL  WNL   Hip adduction    Hip internal rotation Severe limitation  Severe limitation   Hip external rotation Mild limitation  Mild limitation   Knee flexion    Knee extension    Ankle dorsiflexion    Ankle plantarflexion    Ankle inversion    Ankle eversion     (Blank rows  = not tested)  Lumbar ROM:   flexion WNL RFIS no changes; extension mild limitation/mild stiffness and pain REIS mild pain R lumbar spine; lateral flexion L lateral flexion moderate limitation R lateral flexion WNL  LOWER EXTREMITY MMT:  MMT Right eval Left eval  Hip flexion 3 4+  Hip extension 3 4  Hip abduction 4 3+  Hip adduction    Hip internal rotation    Hip external rotation    Knee flexion 5 4+  Knee extension 5 4+  Ankle dorsiflexion 5 5  Ankle plantarflexion    Ankle inversion    Ankle eversion     (Blank rows = not tested)  LOWER EXTREMITY SPECIAL TESTS:  Hip special tests: LLD (-); FADIR (-), FABER (+)  FUNCTIONAL TESTS:    GAIT: Distance walked: in clinic distances  Assistive device utilized: None Level of assistance: Complete Independence Comments: WNL    TODAY'S TREATMENT:                                                                                                                              DATE:  1/4 Manual: Posterior and inferior glides with test re-test for hip external rotation movement. Better moement felt with mobilization. Talked to the patient about having her husband do it at home  Leg press: lifestyles 55 lbs 3x15  Hip abduction machine 3x10 55 lbs   Lateral band walk 2x10  Monster walk 2x10  Short walking lunge 2x10  Backwards lunge 2x10   Leg press 3x15 60 lbs  Knee extension 3x15 30 lbs   1/2 Manual: Posterior and inferior glides with test re-test for hip external rotation movement. Better moement felt with mobilization. Talked to the patient about having her husband do it at home.   Leg press: lifestyles 55 lbs 3x15  Hip abduction machine 3x10 55 lbs  Forward lunges onto BOSU x15 B increased lunge depth today as tolerated  Lateral lunges onto BOSU x15 B increased lunge depth today as tolerated    1  PATIENT EDUCATION:  Education details: exercise form and purpose, possible DOMS and management  Person educated:  Patient  Education method: Explanation, Demonstration, and Handouts Education comprehension: verbalized understanding and returned demonstration  HOME EXERCISE PROGRAM: Access Code: [email or web address] URL: [email or web address] Date: 03/02/2022 Prepared by: Deniece Ree  Exercises - Supine Bridge with Resistance Band  - 2 x daily - 7 x weekly - 1 sets - 10 reps - 2 hold - Clamshell with Resistance  - 2 x daily - 7 x weekly - 1 sets - 10 reps - 2 hold - Hip Abduction with Resistance Loop  - 2 x daily - 7 x weekly - 1 sets - 10 reps - Standing Hip Hiking  - 2 x daily - 7 x weekly - 1 sets - 10 reps - 1 hold  ASSESSMENT:  CLINICAL IMPRESSION: The patient is making good progress. We reviewed a band walk series with her today in 4 directions. She had no significant increase in pain> Today manual therapy made not significant difference. We will trial manual therapy 1 more time to see if we can get the end range movement back. She ahs no plans on joining a gym. We will review further exercises that she can do at home.   OBJECTIVE IMPAIRMENTS: decreased mobility, decreased ROM, decreased strength, increased fascial restrictions, improper body mechanics, postural dysfunction, and pain.   ACTIVITY LIMITATIONS: carrying, lifting, bending, sitting, standing, stairs, and locomotion level  PARTICIPATION LIMITATIONS: cleaning, laundry, shopping, community activity, and occupation  PERSONAL FACTORS: Age, Behavior pattern, Fitness, Profession, and Time since onset of injury/illness/exacerbation are also affecting patient's functional outcome.   REHAB POTENTIAL: Good  CLINICAL DECISION MAKING: Stable/uncomplicated  EVALUATION COMPLEXITY: Low   GOALS: Goals reviewed with patient? Yes  SHORT TERM GOALS: Target date: 03/23/2022   Will be compliant with appropriate progressive HEP  Baseline: Goal status: MET  2.  Pain to be no more than 6/10 at worst  Baseline:  Goal status: MET 12/19-  6/10 at worst   3.  Will be able to sit in figure 4 position for short periods without increase in pain to improve ability to put on shoes/socks  Baseline:  Goal status: IN PROGRESS 12/19- improving, still having low level pain putting on shoes and socks but better   4.  Hip rotation mobility deficits to have improved by at least 50%  Baseline:  Goal status: IN PROGRESS 12/19- IR seems to be a bit limited still     LONG TERM GOALS: Target date: 04/13/2022    MMT to have improved by at least 1 grade in all weak groups  Baseline:  Goal status: INITIAL  2.  Will be able to sit cross legged and figure 4 positions for at least 30 minutes with pain no more than 3/10  Baseline:  Goal status: INITIAL  3.  Will be able to get up from the floor with pain no more than 3/10 Baseline:  Goal status: INITIAL  4.  Will be able to sit for at least 60-90 minutes without increase in pain  Baseline:  Goal status: INITIAL     PLAN:  PT FREQUENCY: 2x/week  PT DURATION: 6 weeks  PLANNED INTERVENTIONS: Therapeutic exercises, Therapeutic activity, Neuromuscular re-education, Balance training, Gait training, Patient/Family education, Self Care, Joint mobilization, Stair training, Aquatic Therapy, Dry Needling, Electrical stimulation, Spinal mobilization, Cryotherapy, Moist heat, Taping, Ultrasound, Ionotophoresis 8m/ml Dexamethasone, Manual therapy, and Re-evaluation  PLAN FOR NEXT SESSION: hip ROM and strength, consider hip joint mobs and traction   PHYSICAL THERAPY DISCHARGE SUMMARY  Visits from Start of Care: 6  Current functional level related to  goals / functional outcomes: Improved overall pain    Remaining deficits: Pain with 1 particualr position (cross legged)    Education / Equipment: HEP    Patient agrees to discharge. Patient goals were partially met. Patient is being discharged due to not returning since the last visit.   Carolyne Littles PT DPT  04/01/2022, 11:06 AM

## 2022-04-03 ENCOUNTER — Encounter (HOSPITAL_BASED_OUTPATIENT_CLINIC_OR_DEPARTMENT_OTHER): Payer: Self-pay | Admitting: Physical Therapy

## 2022-04-07 ENCOUNTER — Encounter (HOSPITAL_BASED_OUTPATIENT_CLINIC_OR_DEPARTMENT_OTHER): Payer: BC Managed Care – PPO | Admitting: Physical Therapy

## 2022-04-09 ENCOUNTER — Encounter (HOSPITAL_BASED_OUTPATIENT_CLINIC_OR_DEPARTMENT_OTHER): Payer: BC Managed Care – PPO | Admitting: Physical Therapy

## 2022-05-13 DIAGNOSIS — H16223 Keratoconjunctivitis sicca, not specified as Sjogren's, bilateral: Secondary | ICD-10-CM | POA: Diagnosis not present

## 2022-05-13 DIAGNOSIS — H0288A Meibomian gland dysfunction right eye, upper and lower eyelids: Secondary | ICD-10-CM | POA: Diagnosis not present

## 2022-05-13 DIAGNOSIS — H0288B Meibomian gland dysfunction left eye, upper and lower eyelids: Secondary | ICD-10-CM | POA: Diagnosis not present

## 2022-08-31 ENCOUNTER — Telehealth: Payer: Self-pay

## 2022-08-31 NOTE — Telephone Encounter (Signed)
Patient aware that she is scheduled to follow up with Dr Barron Alvine (former pt of Dr Christella Hartigan) on 12-03-22 at 9:40 am for 2 year follow up dx GERD.  Patient agreed to plan and verbalized understanding.  No further questions.

## 2022-10-27 DIAGNOSIS — L218 Other seborrheic dermatitis: Secondary | ICD-10-CM | POA: Diagnosis not present

## 2022-10-27 DIAGNOSIS — Z1283 Encounter for screening for malignant neoplasm of skin: Secondary | ICD-10-CM | POA: Diagnosis not present

## 2022-10-27 DIAGNOSIS — D225 Melanocytic nevi of trunk: Secondary | ICD-10-CM | POA: Diagnosis not present

## 2022-12-03 ENCOUNTER — Ambulatory Visit (INDEPENDENT_AMBULATORY_CARE_PROVIDER_SITE_OTHER): Payer: BC Managed Care – PPO | Admitting: Gastroenterology

## 2022-12-03 ENCOUNTER — Encounter: Payer: Self-pay | Admitting: Gastroenterology

## 2022-12-03 VITALS — BP 118/80 | HR 68 | Ht 63.5 in | Wt 149.0 lb

## 2022-12-03 DIAGNOSIS — K219 Gastro-esophageal reflux disease without esophagitis: Secondary | ICD-10-CM | POA: Diagnosis not present

## 2022-12-03 DIAGNOSIS — K580 Irritable bowel syndrome with diarrhea: Secondary | ICD-10-CM

## 2022-12-03 DIAGNOSIS — Z86010 Personal history of colonic polyps: Secondary | ICD-10-CM

## 2022-12-03 DIAGNOSIS — K529 Noninfective gastroenteritis and colitis, unspecified: Secondary | ICD-10-CM

## 2022-12-03 MED ORDER — PANTOPRAZOLE SODIUM 40 MG PO TBEC: DELAYED_RELEASE_TABLET | ORAL | 5 refills | Status: AC

## 2022-12-03 MED ORDER — COLESTIPOL HCL 1 G PO TABS: 1.0000 g | ORAL_TABLET | Freq: Every day | ORAL | 5 refills | Status: AC

## 2022-12-03 NOTE — Progress Notes (Signed)
Chief Complaint:    GERD, medication refill  GI History: 1. GERD in September 2007. She had EGD on 12/17/2005 which was a normal examination. She was felt to have nonerosive GERD.  Reflux controlled with pantoprazole 40 mg every other day. 2. Chronic loose stools, likely bile acid related; started after GB resection (1998).  Cholestyramine started 2016 has significantly helped. 3.  Erosion versus shallow ulcer distal end of the IC valve noted by colonoscopy August 2020.  Biopsies taken.  Pathology was quite unusual and read as "sessile serrated polyp" I discussed this with pathology personally.  The site of the erosion was reevaluated by colonoscopy October 2020 clearly healed.  Mucosa was still slightly edematous and biopsies were taken.  2 subcentimeter polyps were also found and removed.  Biopsies showed no sign of cancer.  The polyps were typical adenomas.  Recall colonoscopy 7 years.  HPI:     Patient is a 52 y.o. female presenting [facility] for follow-up.  Previously followed with Dr. Christella Hartigan, last seen on 10/04/2020.  Was doing well at that time and refilled Colestid and PPI.  She states she is feeling well and no active issues. Takes Protonix Q3 days and that controls sxs well. Takes colestipol prn. Needs refills of both.   Reviewed last set of labs from 12/2021: Normal CBC, CMP, TSH.  No recent abdominal imaging for review.     Latest Ref Rng & Units 01/01/2022    2:57 PM 01/08/2021    3:20 PM 01/03/2020    2:57 PM  CBC  WBC 3.8 - 10.8 Thousand/uL 5.2  6.7  5.5   Hemoglobin 11.7 - 15.5 g/dL 60.4  54.0  98.1   Hematocrit 35.0 - 45.0 % 37.5  38.5  38.3   Platelets 140 - 400 Thousand/uL 231  249  256       Latest Ref Rng & Units 01/01/2022    2:57 PM 01/08/2021    3:20 PM 01/03/2020    2:57 PM  CMP  Glucose 65 - 99 mg/dL 77  70  81   BUN 7 - 25 mg/dL 13  13  12    Creatinine 0.50 - 1.03 mg/dL 1.91  4.78  2.95   Sodium 135 - 146 mmol/L 142  140  137    Potassium 3.5 - 5.3 mmol/L 4.1  4.8  4.0   Chloride 98 - 110 mmol/L 104  104  103   CO2 20 - 32 mmol/L 28  26  26    Calcium 8.6 - 10.4 mg/dL 9.7  9.4  9.4   Total Protein 6.1 - 8.1 g/dL 6.9  6.8  6.7   Total Bilirubin 0.2 - 1.2 mg/dL 0.9  1.5  1.4   AST 10 - 35 U/L 25  16  16    ALT 6 - 29 U/L 19  13  18       Review of systems:     No chest pain, no SOB, no fevers, no urinary sx   Past Medical History:  Diagnosis Date   Allergy    Anemia    Anxiety    Depression    GERD (gastroesophageal reflux disease)    IBS (irritable bowel syndrome)    Migraines    Restless leg syndrome     Patient's surgical history, family medical history, social history, medications and allergies were all reviewed in Epic    Current Outpatient Medications  Medication Sig Dispense Refill   Cholecalciferol (VITAMIN D PO) Take  6,000 Units by mouth.       colestipol (COLESTID) 1 g tablet TAKE 1 TABLET BY MOUTH DAILY. 30 tablet 11   pantoprazole (PROTONIX) 40 MG tablet TAKE 1 TABLET BY MOUTH EVERY DAY 30 tablet 11   No current facility-administered medications for this visit.    Physical Exam:     BP 118/80 (BP Location: Left Arm, Patient Position: Sitting, Cuff Size: Normal)   Pulse 68   Ht 5' 3.5" (1.613 m)   Wt 149 lb (67.6 kg)   LMP 01/05/2019 (Exact Date)   SpO2 97%   BMI 25.98 kg/m   GENERAL:  Pleasant female in NAD PSYCH: : Cooperative, normal affect Musculoskeletal:  Normal muscle tone, normal strength NEURO: Alert and oriented x 3, no focal neurologic deficits   IMPRESSION and PLAN:    1) GERD - Reflux symptoms well-controlled on current therapy - Refill placed for Protonix - Continue antireflux lifestyle/dietary modifications  2) IBS-D 3) Bile acid diarrhea - Symptoms well-controlled on current therapy and dietary/lifestyle modifications - Refill placed for colestipol to continue on-demand - Continue current dietary/lifestyle modifications  4) History of colon polyps -  Repeat colonoscopy in 2027 for ongoing polyp surveillance   RTC in 2 years or sooner prn   Verlin Dike Yeimi Debnam ,DO, FACG 12/03/2022, 9:46 AM

## 2022-12-03 NOTE — Patient Instructions (Signed)
We have sent the following medications to your pharmacy for you to pick up at your convenience: Protonix, Colestipol  Please follow up in 2 years. Give [place] a call at [phone] to schedule an appointment.   _______________________________________________________  If your blood pressure at your visit was 140/90 or greater, please contact your primary care physician to follow up on this.  _______________________________________________________  If you are age 25 or older, your body mass index should be between 23-30. Your Body mass index is 25.98 kg/m. If this is out of the aforementioned range listed, please consider follow up with your Primary Care Provider.  If you are age 33 or younger, your body mass index should be between 19-25. Your Body mass index is 25.98 kg/m. If this is out of the aformentioned range listed, please consider follow up with your Primary Care Provider.   __________________________________________________________  The [facility] providers would like to encourage you to use [facility] to communicate with providers for non-urgent requests or questions.  Due to long hold times on the telephone, sending your provider a message by [facility] may be a faster and more efficient way to get a response.  Please allow 48 business hours for a response.  Please remember that this is for non-urgent requests.   Thank you for choosing me and [facility].  Vito Cirigliano, D.O.

## 2022-12-09 DIAGNOSIS — L02821 Furuncle of head [any part, except face]: Secondary | ICD-10-CM | POA: Diagnosis not present

## 2022-12-09 DIAGNOSIS — B9689 Other specified bacterial agents as the cause of diseases classified elsewhere: Secondary | ICD-10-CM | POA: Diagnosis not present

## 2023-01-04 ENCOUNTER — Encounter: Payer: BC Managed Care – PPO | Admitting: Internal Medicine

## 2023-01-07 DIAGNOSIS — Z1231 Encounter for screening mammogram for malignant neoplasm of breast: Secondary | ICD-10-CM | POA: Diagnosis not present

## 2023-01-07 DIAGNOSIS — Z124 Encounter for screening for malignant neoplasm of cervix: Secondary | ICD-10-CM | POA: Diagnosis not present

## 2023-01-07 DIAGNOSIS — Z6827 Body mass index (BMI) 27.0-27.9, adult: Secondary | ICD-10-CM | POA: Diagnosis not present

## 2023-01-07 DIAGNOSIS — Z01419 Encounter for gynecological examination (general) (routine) without abnormal findings: Secondary | ICD-10-CM | POA: Diagnosis not present

## 2023-01-08 ENCOUNTER — Encounter: Payer: BC Managed Care – PPO | Admitting: Nurse Practitioner

## 2023-01-12 ENCOUNTER — Ambulatory Visit (INDEPENDENT_AMBULATORY_CARE_PROVIDER_SITE_OTHER): Payer: BC Managed Care – PPO | Admitting: Nurse Practitioner

## 2023-01-12 ENCOUNTER — Encounter: Payer: Self-pay | Admitting: Nurse Practitioner

## 2023-01-12 VITALS — BP 110/76 | HR 73 | Temp 97.7°F | Ht 62.75 in | Wt 151.0 lb

## 2023-01-12 DIAGNOSIS — Z1389 Encounter for screening for other disorder: Secondary | ICD-10-CM | POA: Diagnosis not present

## 2023-01-12 DIAGNOSIS — Z131 Encounter for screening for diabetes mellitus: Secondary | ICD-10-CM | POA: Diagnosis not present

## 2023-01-12 DIAGNOSIS — Z0000 Encounter for general adult medical examination without abnormal findings: Secondary | ICD-10-CM | POA: Diagnosis not present

## 2023-01-12 DIAGNOSIS — Z1211 Encounter for screening for malignant neoplasm of colon: Secondary | ICD-10-CM

## 2023-01-12 DIAGNOSIS — R232 Flushing: Secondary | ICD-10-CM

## 2023-01-12 DIAGNOSIS — Z136 Encounter for screening for cardiovascular disorders: Secondary | ICD-10-CM | POA: Diagnosis not present

## 2023-01-12 DIAGNOSIS — Z79899 Other long term (current) drug therapy: Secondary | ICD-10-CM

## 2023-01-12 DIAGNOSIS — R52 Pain, unspecified: Secondary | ICD-10-CM

## 2023-01-12 DIAGNOSIS — E559 Vitamin D deficiency, unspecified: Secondary | ICD-10-CM

## 2023-01-12 DIAGNOSIS — Z1329 Encounter for screening for other suspected endocrine disorder: Secondary | ICD-10-CM

## 2023-01-12 DIAGNOSIS — Z13220 Encounter for screening for lipoid disorders: Secondary | ICD-10-CM | POA: Diagnosis not present

## 2023-01-12 DIAGNOSIS — I10 Essential (primary) hypertension: Secondary | ICD-10-CM | POA: Diagnosis not present

## 2023-01-12 DIAGNOSIS — R7303 Prediabetes: Secondary | ICD-10-CM

## 2023-01-12 DIAGNOSIS — K219 Gastro-esophageal reflux disease without esophagitis: Secondary | ICD-10-CM

## 2023-01-12 DIAGNOSIS — K580 Irritable bowel syndrome with diarrhea: Secondary | ICD-10-CM

## 2023-01-12 DIAGNOSIS — Z0001 Encounter for general adult medical examination with abnormal findings: Secondary | ICD-10-CM

## 2023-01-12 NOTE — Progress Notes (Signed)
Complete Physical  Assessment and Plan:  Gastroesophageal reflux disease, unspecified whether esophagitis present Continue pantoprazole PRN Follow Dr. Christella Hartigan, GI No suspected reflux complications (Barret/stricture). Lifestyle modification:  wt loss, avoid meals 2-3h before bedtime. Consider eliminating food triggers:  chocolate, caffeine, EtOH, acid/spicy food.  Irritable bowel syndrome with diarrhea Follows with Dr. Christella Hartigan, GI Continue Colestipol takes PRN  Vitamin D deficiency Continue supplement Monitor levels  Prediabetes Education: Reviewed 'ABCs' of diabetes management  Discussed goals to be met and/or maintained include A1C (<7) Blood pressure (<130/80) Cholesterol (LDL <70) Continue Eye Exam yearly  Continue Dental Exam Q6 mo Discussed dietary recommendations Discussed Physical Activity recommendations Check A1C  Medication management All medications discussed and reviewed in full. All questions and concerns regarding medications addressed.    Screening for colorectal cancer Colonoscopy 2020 with 7 year recall Due 2027  Screening for ischemic heart disease EKG  Screening for thyroid disorder TSH  Screening for cholesterol level Lipid panel  Screening for hematuria or proteinuria UA  Screening for cholesterol Lipid  Generalized pain Elevated CRP and ESR - discussed possible polymyalgia rheumatica Negative ANA  Possible menopausal contributor Continue to monitor Refer if s/s fail to improve or negative work up in clinic.  Hot flashes Failed black cohosh herbal supplement Continue Estroven  Continue to follow with GYN  Orders Placed This Encounter  Procedures   CBC with Differential/Platelet   COMPLETE METABOLIC PANEL WITH GFR   Magnesium   Lipid panel   TSH   Hemoglobin A1c   Insulin, random   VITAMIN D 25 Hydroxy (Vit-D Deficiency, Fractures)   Urinalysis, Routine w reflex microscopic   Microalbumin / creatinine urine ratio   EKG  12-Lead    Notify office for further evaluation and treatment, questions or concerns if any reported s/s fail to improve.   The patient was advised to call back or seek an in-person evaluation if any symptoms worsen or if the condition fails to improve as anticipated.   Further disposition pending results of labs. Discussed med's effects and SE's.    I discussed the assessment and treatment plan with the patient. The patient was provided an opportunity to ask questions and all were answered. The patient agreed with the plan and demonstrated an understanding of the instructions.  Discussed med's effects and SE's. Screening labs and tests as requested with regular follow-up as recommended.  I provided 54 minutes of face-to-face time during this encounter including counseling, chart review, and critical decision making was preformed.  Today's Plan of Care is based on a patient-centered health care approach known as shared decision making - the decisions, tests and treatments allow for patient preferences and values to be balanced with clinical evidence.    Future Appointments  Date Time Provider Department Center  10/54/2025  9:00 AM Adela Glimpse, NP [facility] None    HPI  52 y.o. female  presents for a complete physical. She has GERD (gastroesophageal reflux disease); IBS (irritable bowel syndrome); Migraines; Vitamin D deficiency; Screening for colorectal cancer; and Prediabetes on their problem list.  Overall she reports feeling well today.    She is continuing to go through menopause.  She had noticed increase in hot flashes and joint pain, insomnia.  Started Jeffie Pollock which has helped manage symptoms.  She had tried black Cohosh but this was not effective.  She continues to feel as though it is harder to get dressed in the mornings.  Unable to put on shirt without pain,  unable to take off  bra.  Most notably in the right hip, has trouble laying on that area. Discussed with GYN and was  instructed to "move more."  She is working on increasing exercise. She has noticed moodieness, wanting to cry, the pain has become more significant since 03/2021.  She did the blood work with GYN in 05/2021 to confirm menopause.  She had pap amd mammogram. All normal.    She had Covid 2021 but not recent.  She is not vaccinated   BMI is Body mass index is 26.96 kg/m., she has been working on diet and exercise.  She is now attempting intermittent fasting.  Wt Readings from Last 3 Encounters:  01/12/23 151 lb (68.5 kg)  12/03/22 149 lb (67.6 kg)  01/01/22 146 lb 6.4 oz (66.4 kg)   Her blood pressure has been controlled at home, today their BP is BP: 110/76 She does workout. She denies chest pain, shortness of breath, dizziness.   She is not on cholesterol medication and denies myalgias. Her cholesterol is at goal. The cholesterol last visit was:   Lab Results  Component Value Date   CHOL 186 10/54/2023   HDL 81 10/54/2023   LDLCALC 90 10/54/2023   TRIG 67 10/54/2023   CHOLHDL 2.3 01/01/2022   She has been working on diet and exercise for prediabetes, she is on bASA, she is not on ACE/ARB and denies polydipsia and polyuria. Last A1C in the office was:  Lab Results  Component Value Date   HGBA1C 5.5 01/01/2022   Last GFR: Lab Results  Component Value Date   EGFR 98 01/01/2022   Patient is on Vitamin D supplement.   Lab Results  Component Value Date   VD25OH 49 01/01/2022      Current Medications:  Current Outpatient Medications on File Prior to Visit  Medication Sig Dispense Refill   Cholecalciferol (VITAMIN D PO) Take 6,000 Units by mouth.       colestipol (COLESTID) 1 g tablet Take 1 tablet (1 g total) by mouth daily. 90 tablet 5   cyanocobalamin (VITAMIN B12) 1000 MCG tablet Take 1,000 mcg by mouth daily.     Multiple Vitamins-Minerals (ESTROVEN MENOPAUSE SUPPLEMENT PO) Take by mouth.     pantoprazole (PROTONIX) 40 MG tablet TAKE 1 TABLET BY MOUTH EVERY DAY 90 tablet 5    No current facility-administered medications on file prior to visit.   Allergies:  Allergies  Allergen Reactions   Imitrex [Sumatriptan]     Throat swelling   Viberzi [Eluxadoline]     Throat swelling    Celexa [Citalopram]     Decreased libido   Codeine     N/V   Topamax [Topiramate]     Neurological changes   Zoloft [Sertraline Hcl]     N/V   Medical History:  She has GERD (gastroesophageal reflux disease); IBS (irritable bowel syndrome); Migraines; Vitamin D deficiency; Screening for colorectal cancer; and Prediabetes on their problem list.  Health Maintenance:   Immunization History  Administered Date(s) Administered   Influenza Inj Mdck Quad With Preservative 01/03/2020   Influenza Split 01/14/2011, 02/16/2013, 01/10/2015   Influenza,inj,Quad PF,6+ Mos 01/08/2021   Influenza-Unspecified 02/11/2014   PPD Test 08/10/2013, 08/14/2014, 08/15/2015, 10/05/2016, 11/02/2017, 12/01/2018, 01/03/2020, 01/08/2021   Pneumococcal-Unspecified 07/29/2010   Td 03/31/2003   Tdap 08/14/2014, 07/20/2016   Health Maintenance  Topic Date Due   COVID-19 Vaccine (1) Never done   Zoster Vaccines- Shingrix (1 of 2) Never done   Cervical Cancer Screening (HPV/Pap Cotest)  07/13/2020  MAMMOGRAM  09/22/2020   INFLUENZA VACCINE  10/29/2022   Colonoscopy  01/05/2026   DTaP/Tdap/Td (4 - Td or Tdap) 07/21/2026   Hepatitis C Screening  Completed   HIV Screening  Completed   HPV VACCINES  Aged Out    LMP: Patient's last menstrual period was 01/05/2019 (exact date). Sexually Active: yes STD testing offered Pap: 12/2022 - Normal MGM: 12/2022 DEXA: Due age 89  Colonoscopy:  2020 EGD:  Last Dental Exam: Every 6 Months - Dr. Darcus Austin  Last Eye Exam:  Dr. Annice Pih [facility] 08/2022 Last Derm Exam: [facility], [place] - Marchelle Folks Mcconnell 2024  Patient Care Team: Lucky Cowboy, MD as PCP - General (Internal Medicine)  Surgical History:  She has a past surgical history that  includes TERMINATION OF [facility] and Cholecystectomy. Family History:  Herfamily history includes CVA in her maternal grandfather; Diabetes in her mother; Heart attack in her mother; Hypertension in her father and mother. Social History:  She reports that she quit smoking about 26 years ago. Her smoking use included cigarettes. She has never used smokeless tobacco. She reports current alcohol use. She reports that she does not use drugs.  Review of Systems: Review of Systems  Constitutional: Negative.   HENT: Negative.    Eyes: Negative.   Respiratory: Negative.    Cardiovascular: Negative.   Gastrointestinal:  Positive for diarrhea and heartburn.  Genitourinary: Negative.   Musculoskeletal:  Positive for joint pain.  Skin: Negative.   Neurological: Negative.   Endo/Heme/Allergies: Negative.   Psychiatric/Behavioral:  Positive for depression. The patient is nervous/anxious and has insomnia.     Physical Exam: Estimated body mass index is 26.96 kg/m as calculated from the following:   Height as of this encounter: 5' 2.75" (1.594 m).   Weight as of this encounter: 151 lb (68.5 kg). BP 110/76   Pulse 73   Temp 97.7 F (36.5 C)   Ht 5' 2.75" (1.594 m)   Wt 151 lb (68.5 kg)   LMP 01/05/2019 (Exact Date)   SpO2 99%   BMI 26.96 kg/m   General Appearance: Well nourished, well developed, in no apparent distress.  Eyes: PERRLA, EOMs, conjunctiva no swelling or erythema, normal fundi and vessels.  Sinuses: No Frontal/maxillary tenderness  ENT/Mouth: Ext aud canals clear, normal light reflex with TMs without erythema, bulging. Good dentition. No erythema, swelling, or exudate on post pharynx. Tonsils not swollen or erythematous. Hearing normal.  Neck: Supple, thyroid normal. No bruits  Respiratory: Respiratory effort normal, BS equal bilaterally without rales, rhonchi, wheezing or stridor.  Cardio: RRR without murmurs, rubs or gallops. Brisk peripheral pulses without edema.  Chest:  symmetric, with normal excursions and percussion.  Breasts: Symmetric, without lumps, nipple discharge, retractions.  Abdomen: Soft, nontender, no guarding, rebound, hernias, masses, or organomegaly.  Lymphatics: Non tender without lymphadenopathy.  Musculoskeletal: Full ROM all peripheral extremities,5/5 strength, and normal gait.  Skin: Warm, dry without rashes, lesions, ecchymosis. Neuro: Cranial nerves intact, reflexes equal bilaterally. Normal muscle tone, no cerebellar symptoms. Sensation intact.  Psych: Awake and oriented X 3, normal affect, Insight and Judgment appropriate.  Genitourinary: Female genitalia: not done - defer.    EKG: WNL no ST changes.  NSR with PAC   Adela Glimpse, NP 9:48 AM [facility]

## 2023-01-12 NOTE — Patient Instructions (Signed)
Do Some Research on the Following:  Polymyalgia Rheumatica Polymyalgia rheumatica (PMR) is an inflammatory disorder that causes the muscles and joints to ache and become stiff. Sometimes, PMR leads to a more dangerous condition that can cause vision loss (temporal arteritis or giant cell arteritis). What are the causes? The exact cause of PMR is not known. What increases the risk? You are more likely to develop this condition if you are: Female. 55 years old or older. Of [place] descent. What are the signs or symptoms? Pain and stiffness are the main symptoms of PMR and affect both sides of the body. These symptoms: May be worse after inactivity and in the morning. May affect your: Hips, buttocks, and thighs. Neck, arms, and shoulders. This can make it hard to raise your arms above your head. Hands and wrists. Other symptoms include: Fever. Tiredness. Weakness. Depression. Decreased appetite. This may lead to weight loss. Symptoms could start slowly but often come on suddenly, sometimes even overnight, or over a few days. How is this diagnosed? This condition is diagnosed with your medical history and a physical exam. You may need to see a health care provider who specializes in diseases of the joints, muscles, and bones (rheumatologist). You may also have tests, including: Blood tests. Often, test results that show inflammation are very elevated. Imaging studies like X-rays or ultrasound, which may be done to rule out other conditions. These are often usual in PMR. How is this treated? PMR usually goes away without treatment, but it may take years. PMR often responds rapidly (within a few days) to low-dose glucocorticoids (steroids). These medicines have serious side effects. Once your symptoms are better controlled, the dose should be lowered to find the lowest possible dose that controls your symptoms. Regular exercise and rest will also help your symptoms. Follow these  instructions at home:  Take over-the-counter and prescription medicines only as told by your health care provider. Make sure to get enough rest and sleep. Eat a healthy and nutritious diet that includes calcium and vitamin D. Calcium is important for bone health, and vitamin D helps your body absorb calcium. Good sources of calcium and vitamin D include: Certain fatty fish, such as salmon and tuna. Products that have calcium and vitamin D added to them (are fortified), such as fortified cereals. Collard greens, turnip greens, broccoli, and kale. Egg yolks. Cheese. Liver. Try to exercise most days of the week. Ask your health care provider what type of exercises are best for you. Keep all follow-up visits. This is important. Contact a health care provider if: Your symptoms do not improve with medicine. You have side effects from steroids. These may include: Weight gain. Swelling. Insomnia. Mood changes. Bruising. High blood sugar readings, if you have diabetes. Higher than normal blood pressure readings, if you monitor your blood pressure. Get help right away if: You develop symptoms of temporal arteritis, such as: A change in vision. Severe headache. Scalp pain. Jaw pain. These symptoms may represent a serious problem that is an emergency. Do not wait to see if the symptoms will go away. Get medical help right away. Call your local emergency services (911 in the [place] Do not drive yourself to the hospital. Summary Polymyalgia rheumatica is an inflammatory disorder that causes aching and stiffness in your muscles and joints. The exact cause of this condition is not known. This condition usually goes away without treatment. Your health care provider may give you low-dose glucocorticoids (steroids) to help manage your pain and  stiffness. Rest and regular exercise will help the symptoms. This information is not intended to replace advice given to you by your health care provider. Make  sure you discuss any questions you have with your health care provider. Document Revised: 07/18/2020 Document Reviewed: 07/18/2020 Elsevier Patient Education  2024 [employer].

## 2023-01-13 LAB — CBC WITH DIFFERENTIAL/PLATELET
Absolute Lymphocytes: 1088 {cells}/uL (ref 850–3900)
Absolute Monocytes: 361 {cells}/uL (ref 200–950)
Basophils Absolute: 22 {cells}/uL (ref 0–200)
Basophils Relative: 0.5 %
Eosinophils Absolute: 202 {cells}/uL (ref 15–500)
Eosinophils Relative: 4.7 %
HCT: 38.5 % (ref 35.0–45.0)
Hemoglobin: 12.5 g/dL (ref 11.7–15.5)
MCH: 30.3 pg (ref 27.0–33.0)
MCHC: 32.5 g/dL (ref 32.0–36.0)
MCV: 93.4 fL (ref 80.0–100.0)
MPV: 10.6 fL (ref 7.5–12.5)
Monocytes Relative: 8.4 %
Neutro Abs: 2627 {cells}/uL (ref 1500–7800)
Neutrophils Relative %: 61.1 %
Platelets: 212 10*3/uL (ref 140–400)
RBC: 4.12 10*6/uL (ref 3.80–5.10)
RDW: 11.9 % (ref 11.0–15.0)
Total Lymphocyte: 25.3 %
WBC: 4.3 10*3/uL (ref 3.8–10.8)

## 2023-01-13 LAB — URINALYSIS, ROUTINE W REFLEX MICROSCOPIC
Bacteria, UA: NONE SEEN /[HPF]
Bilirubin Urine: NEGATIVE
Glucose, UA: NEGATIVE
Hgb urine dipstick: NEGATIVE
Hyaline Cast: NONE SEEN /[LPF]
Ketones, ur: NEGATIVE
Leukocytes,Ua: NEGATIVE
Nitrite: NEGATIVE
Protein, ur: NEGATIVE
RBC / HPF: NONE SEEN /[HPF] (ref 0–2)
Specific Gravity, Urine: 1.02 (ref 1.001–1.035)
Squamous Epithelial / HPF: NONE SEEN /[HPF] (ref <=5)
WBC, UA: NONE SEEN /[HPF] (ref 0–5)
pH: 5.5 (ref 5.0–8.0)

## 2023-01-13 LAB — LIPID PANEL
Cholesterol: 189 mg/dL (ref <200)
HDL: 88 mg/dL (ref >=50)
LDL Cholesterol (Calc): 86 mg/dL
Non-HDL Cholesterol (Calc): 101 mg/dL (ref <130)
Total CHOL/HDL Ratio: 2.1 (calc) (ref <5.0)
Triglycerides: 68 mg/dL (ref <150)

## 2023-01-13 LAB — COMPLETE METABOLIC PANEL WITH GFR
AG Ratio: 1.4 (calc) (ref 1.0–2.5)
ALT: 17 U/L (ref 6–29)
AST: 23 U/L (ref 10–35)
Albumin: 4 g/dL (ref 3.6–5.1)
Alkaline phosphatase (APISO): 107 U/L (ref 37–153)
BUN: 13 mg/dL (ref 7–25)
CO2: 26 mmol/L (ref 20–32)
Calcium: 9.4 mg/dL (ref 8.6–10.4)
Chloride: 105 mmol/L (ref 98–110)
Creat: 0.74 mg/dL (ref 0.50–1.03)
Globulin: 2.8 g/dL (ref 1.9–3.7)
Glucose, Bld: 90 mg/dL (ref 65–99)
Potassium: 4.1 mmol/L (ref 3.5–5.3)
Sodium: 142 mmol/L (ref 135–146)
Total Bilirubin: 1.2 mg/dL (ref 0.2–1.2)
Total Protein: 6.8 g/dL (ref 6.1–8.1)
eGFR: 96 mL/min/{1.73_m2} (ref >=60)

## 2023-01-13 LAB — MICROALBUMIN / CREATININE URINE RATIO
Creatinine, Urine: 125 mg/dL (ref 20–275)
Microalb Creat Ratio: 2 mg/g{creat} (ref <30)
Microalb, Ur: 0.2 mg/dL

## 2023-01-13 LAB — MAGNESIUM: Magnesium: 1.9 mg/dL (ref 1.5–2.5)

## 2023-01-13 LAB — HEMOGLOBIN A1C
Hgb A1c MFr Bld: 5.7 %{Hb} — ABNORMAL HIGH (ref <5.7)
Mean Plasma Glucose: 117 mg/dL
eAG (mmol/L): 6.5 mmol/L

## 2023-01-13 LAB — TSH: TSH: 1.7 m[IU]/L

## 2023-01-13 LAB — VITAMIN D 25 HYDROXY (VIT D DEFICIENCY, FRACTURES): Vit D, 25-Hydroxy: 77 ng/mL (ref 30–100)

## 2023-01-13 LAB — INSULIN, RANDOM: Insulin: 3.1 u[IU]/mL

## 2023-04-27 DIAGNOSIS — L309 Dermatitis, unspecified: Secondary | ICD-10-CM | POA: Diagnosis not present

## 2023-05-05 DIAGNOSIS — L509 Urticaria, unspecified: Secondary | ICD-10-CM | POA: Diagnosis not present

## 2023-05-05 DIAGNOSIS — L309 Dermatitis, unspecified: Secondary | ICD-10-CM | POA: Diagnosis not present

## 2023-05-17 DIAGNOSIS — Z48817 Encounter for surgical aftercare following surgery on the skin and subcutaneous tissue: Secondary | ICD-10-CM | POA: Diagnosis not present

## 2023-09-06 DIAGNOSIS — N951 Menopausal and female climacteric states: Secondary | ICD-10-CM | POA: Diagnosis not present

## 2023-09-06 DIAGNOSIS — R319 Hematuria, unspecified: Secondary | ICD-10-CM | POA: Diagnosis not present

## 2023-09-06 DIAGNOSIS — L509 Urticaria, unspecified: Secondary | ICD-10-CM | POA: Diagnosis not present

## 2023-09-06 DIAGNOSIS — R300 Dysuria: Secondary | ICD-10-CM | POA: Diagnosis not present

## 2023-09-27 DIAGNOSIS — L309 Dermatitis, unspecified: Secondary | ICD-10-CM | POA: Diagnosis not present

## 2023-09-30 DIAGNOSIS — L249 Irritant contact dermatitis, unspecified cause: Secondary | ICD-10-CM | POA: Diagnosis not present

## 2023-09-30 DIAGNOSIS — L508 Other urticaria: Secondary | ICD-10-CM | POA: Diagnosis not present

## 2023-10-11 DIAGNOSIS — L508 Other urticaria: Secondary | ICD-10-CM | POA: Diagnosis not present

## 2023-11-10 DIAGNOSIS — L501 Idiopathic urticaria: Secondary | ICD-10-CM | POA: Diagnosis not present

## 2023-11-10 DIAGNOSIS — J301 Allergic rhinitis due to pollen: Secondary | ICD-10-CM | POA: Diagnosis not present

## 2023-12-17 DIAGNOSIS — L501 Idiopathic urticaria: Secondary | ICD-10-CM | POA: Diagnosis not present

## 2023-12-17 DIAGNOSIS — J301 Allergic rhinitis due to pollen: Secondary | ICD-10-CM | POA: Diagnosis not present

## 2024-01-12 ENCOUNTER — Encounter: Payer: BC Managed Care – PPO | Admitting: Nurse Practitioner

## 2024-01-20 DIAGNOSIS — Z124 Encounter for screening for malignant neoplasm of cervix: Secondary | ICD-10-CM | POA: Diagnosis not present

## 2024-01-20 DIAGNOSIS — Z6827 Body mass index (BMI) 27.0-27.9, adult: Secondary | ICD-10-CM | POA: Diagnosis not present

## 2024-01-20 DIAGNOSIS — Z1151 Encounter for screening for human papillomavirus (HPV): Secondary | ICD-10-CM | POA: Diagnosis not present

## 2024-01-20 DIAGNOSIS — Z01419 Encounter for gynecological examination (general) (routine) without abnormal findings: Secondary | ICD-10-CM | POA: Diagnosis not present

## 2024-01-26 DIAGNOSIS — N951 Menopausal and female climacteric states: Secondary | ICD-10-CM | POA: Diagnosis not present

## 2024-01-26 DIAGNOSIS — L501 Idiopathic urticaria: Secondary | ICD-10-CM | POA: Diagnosis not present

## 2024-01-26 DIAGNOSIS — Z0000 Encounter for general adult medical examination without abnormal findings: Secondary | ICD-10-CM | POA: Diagnosis not present
